# Patient Record
Sex: Male | Born: 1965
Health system: Southern US, Community
[De-identification: ages and names within clinical notes are randomized; demographics above are authoritative.]

## PROBLEM LIST (undated history)

## (undated) DIAGNOSIS — I10 Essential (primary) hypertension: Secondary | ICD-10-CM

## (undated) DIAGNOSIS — Z9109 Other allergy status, other than to drugs and biological substances: Secondary | ICD-10-CM

## (undated) DIAGNOSIS — N200 Calculus of kidney: Secondary | ICD-10-CM

## (undated) DIAGNOSIS — Z87442 Personal history of urinary calculi: Secondary | ICD-10-CM

## (undated) DIAGNOSIS — G4730 Sleep apnea, unspecified: Secondary | ICD-10-CM

## (undated) HISTORY — DX: Other allergy status, other than to drugs and biological substances: Z91.09

## (undated) HISTORY — DX: Essential (primary) hypertension: I10

## (undated) HISTORY — DX: Calculus of kidney: N20.0

---

## 2007-11-13 ENCOUNTER — Ambulatory Visit: Payer: Self-pay | Admitting: Surgery

## 2010-02-12 ENCOUNTER — Emergency Department: Payer: Self-pay | Admitting: Emergency Medicine

## 2012-02-29 ENCOUNTER — Encounter: Payer: Self-pay | Admitting: *Deleted

## 2012-02-29 ENCOUNTER — Telehealth: Payer: Self-pay | Admitting: *Deleted

## 2012-02-29 ENCOUNTER — Encounter: Payer: Self-pay | Admitting: Internal Medicine

## 2012-02-29 ENCOUNTER — Ambulatory Visit (INDEPENDENT_AMBULATORY_CARE_PROVIDER_SITE_OTHER): Payer: No Typology Code available for payment source | Admitting: Internal Medicine

## 2012-02-29 VITALS — BP 120/82 | HR 77 | Temp 97.7°F | Ht 72.0 in | Wt 275.0 lb

## 2012-02-29 DIAGNOSIS — R748 Abnormal levels of other serum enzymes: Secondary | ICD-10-CM

## 2012-02-29 DIAGNOSIS — R5383 Other fatigue: Secondary | ICD-10-CM

## 2012-02-29 DIAGNOSIS — M109 Gout, unspecified: Secondary | ICD-10-CM | POA: Insufficient documentation

## 2012-02-29 DIAGNOSIS — I10 Essential (primary) hypertension: Secondary | ICD-10-CM

## 2012-02-29 DIAGNOSIS — Z139 Encounter for screening, unspecified: Secondary | ICD-10-CM

## 2012-02-29 DIAGNOSIS — D72819 Decreased white blood cell count, unspecified: Secondary | ICD-10-CM

## 2012-02-29 DIAGNOSIS — Z0000 Encounter for general adult medical examination without abnormal findings: Secondary | ICD-10-CM

## 2012-02-29 DIAGNOSIS — Z125 Encounter for screening for malignant neoplasm of prostate: Secondary | ICD-10-CM

## 2012-02-29 DIAGNOSIS — D649 Anemia, unspecified: Secondary | ICD-10-CM | POA: Insufficient documentation

## 2012-02-29 DIAGNOSIS — Z9109 Other allergy status, other than to drugs and biological substances: Secondary | ICD-10-CM | POA: Insufficient documentation

## 2012-02-29 DIAGNOSIS — R5381 Other malaise: Secondary | ICD-10-CM

## 2012-02-29 LAB — LIPID PANEL
HDL: 39.2 mg/dL (ref >39.00)
Triglycerides: 142 mg/dL (ref 0.0–149.0)
VLDL: 28.4 mg/dL (ref 0.0–40.0)

## 2012-02-29 LAB — COMPREHENSIVE METABOLIC PANEL
Alkaline Phosphatase: 46 U/L (ref 39–117)
Creatinine, Ser: 1 mg/dL (ref 0.4–1.5)
Glucose, Bld: 106 mg/dL — ABNORMAL HIGH (ref 70–99)
Sodium: 135 mEq/L (ref 135–145)
Total Bilirubin: 0.5 mg/dL (ref 0.3–1.2)
Total Protein: 7.3 g/dL (ref 6.0–8.3)

## 2012-02-29 LAB — CBC WITH DIFFERENTIAL/PLATELET
Basophils Relative: 0.3 % (ref 0.0–3.0)
Eosinophils Absolute: 0.1 10*3/uL (ref 0.0–0.7)
HCT: 43 % (ref 39.0–52.0)
Lymphs Abs: 1.2 10*3/uL (ref 0.7–4.0)
MCHC: 33.3 g/dL (ref 30.0–36.0)
MCV: 89 fl (ref 78.0–100.0)
Monocytes Absolute: 0.4 10*3/uL (ref 0.1–1.0)
Neutrophils Relative %: 59.5 % (ref 43.0–77.0)
Platelets: 232 10*3/uL (ref 150.0–400.0)

## 2012-02-29 LAB — TSH: TSH: 1.3 u[IU]/mL (ref 0.35–5.50)

## 2012-02-29 LAB — FERRITIN: Ferritin: 41.9 ng/mL (ref 22.0–322.0)

## 2012-02-29 LAB — PSA: PSA: 0.17 ng/mL (ref 0.10–4.00)

## 2012-02-29 MED ORDER — LISINOPRIL 10 MG PO TABS: 10.0000 mg | ORAL_TABLET | Freq: Every day | ORAL | Status: DC

## 2012-02-29 MED ORDER — METOPROLOL SUCCINATE ER 25 MG PO TB24: 25.0000 mg | ORAL_TABLET | Freq: Every day | ORAL | Status: DC

## 2012-02-29 NOTE — Patient Instructions (Addendum)
It was nice seeing you today.  I am glad you have been doing well.  Let me know if you have any problems.  

## 2012-02-29 NOTE — Assessment & Plan Note (Signed)
Blood pressure has been doing well.  Continue Lisinopril and Toprol.  Check metabolic panel.

## 2012-02-29 NOTE — Assessment & Plan Note (Addendum)
Check cbc and iron studies.  Just gave blood this past week.

## 2012-02-29 NOTE — Progress Notes (Signed)
  Subjective:    [name], male    DOB: 12-13-1965, 46 y.o.   MRN: [id number]  HPI 46 year old male with past history of recurring allergy problems and hypertension who comes in today to follow up on these issues as well as for a complete physical exam.  States he is doing well.  Some allergy symptoms at times, but overall feels this is not a problem currently.  Some fatigue.  Overall feels good.  No cardiac symptoms with increased activity or exertion.  No nausea or vomiting.  No acid reflux.  No bowel changes.  States his blood pressure has been doing well.  (114/72).    Past Medical History  Diagnosis Date  . Hypertension   . Environmental allergies   . Nephrolithiasis     Current Outpatient Prescriptions on File Prior to Visit  Medication Sig Dispense Refill  . lisinopril (PRINIVIL,ZESTRIL) 10 MG tablet Take 1 tablet (10 mg total) by mouth daily.  90 tablet  3  . metoprolol succinate (TOPROL-XL) 25 MG 24 hr tablet Take 1 tablet (25 mg total) by mouth daily.  90 tablet  3    Review of Systems Patient denies any headache, lightheadedness or dizziness.  Allergy symptoms controlled.  No chest pain, tightness or palpitations.  No increased shortness of breath, cough or congestion.  No nausea or vomiting.  No abdominal pain or cramping.  No bowel change, such as diarrhea, constipation, BRBPR or melana.  No urine change.        Objective:   Physical Exam Filed Vitals:   02/29/12 0807  BP: 120/82  Pulse: 77  Temp: 97.7 F (73.6 C)   46 year old male in no acute distress.  HEENT:  Nares - clear.  Oropharynx - without lesions. NECK:  Supple.  Nontender.  No audible carotid bruit.  HEART:  Appears to be regular.   LUNGS:  No crackles or wheezing audible.  Respirations even and unlabored.   RADIAL PULSE:  Equal bilaterally.  ABDOMEN:  Soft.  Nontender.  Bowel sounds present and normal.  No audible abdominal bruit.  GU:  Normal descended testicles.  No palpable  testicular nodules.   RECTAL:  Could not appreciate any palpable prostate nodules.  Heme negative.   EXTREMITIES:  No increased edema present.  DP pulses palpable and equal bilaterally.         Assessment & Plan:  HEALTH MAINTENANCE.  Physical today.  PSA 02/15/11 - .16.  Recheck PSA today.

## 2012-02-29 NOTE — Telephone Encounter (Signed)
Opened in error

## 2012-03-04 ENCOUNTER — Encounter: Payer: Self-pay | Admitting: Internal Medicine

## 2012-03-04 NOTE — Assessment & Plan Note (Signed)
Off allopurinol.  No recent flares.  Follow.    

## 2012-03-04 NOTE — Assessment & Plan Note (Signed)
Doing well.  Follow.  

## 2012-03-06 ENCOUNTER — Encounter: Payer: Self-pay | Admitting: *Deleted

## 2012-03-13 ENCOUNTER — Other Ambulatory Visit (INDEPENDENT_AMBULATORY_CARE_PROVIDER_SITE_OTHER): Payer: No Typology Code available for payment source

## 2012-03-13 DIAGNOSIS — R748 Abnormal levels of other serum enzymes: Secondary | ICD-10-CM

## 2012-03-13 DIAGNOSIS — D72819 Decreased white blood cell count, unspecified: Secondary | ICD-10-CM

## 2012-03-13 LAB — CBC WITH DIFFERENTIAL/PLATELET
Basophils Absolute: 0 10*3/uL (ref 0.0–0.1)
Eosinophils Absolute: 0.1 10*3/uL (ref 0.0–0.7)
Lymphocytes Relative: 30.3 % (ref 12.0–46.0)
MCHC: 34 g/dL (ref 30.0–36.0)
Monocytes Relative: 9.4 % (ref 3.0–12.0)
Neutro Abs: 2.8 10*3/uL (ref 1.4–7.7)
Neutrophils Relative %: 57.7 % (ref 43.0–77.0)
Platelets: 224 10*3/uL (ref 150.0–400.0)
RDW: 13.4 % (ref 11.5–14.6)

## 2012-03-13 LAB — HEPATIC FUNCTION PANEL
ALT: 58 U/L — ABNORMAL HIGH (ref 0–53)
AST: 40 U/L — ABNORMAL HIGH (ref 0–37)
Albumin: 4.3 g/dL (ref 3.5–5.2)
Alkaline Phosphatase: 45 U/L (ref 39–117)
Bilirubin, Direct: 0.1 mg/dL (ref 0.0–0.3)
Total Protein: 7.1 g/dL (ref 6.0–8.3)

## 2012-03-14 ENCOUNTER — Other Ambulatory Visit: Payer: Self-pay | Admitting: Internal Medicine

## 2012-03-14 DIAGNOSIS — R945 Abnormal results of liver function studies: Secondary | ICD-10-CM

## 2012-03-14 NOTE — Progress Notes (Signed)
Order placed for f/u liver panel.  

## 2012-03-16 ENCOUNTER — Encounter: Payer: Self-pay | Admitting: Internal Medicine

## 2012-04-14 ENCOUNTER — Other Ambulatory Visit (INDEPENDENT_AMBULATORY_CARE_PROVIDER_SITE_OTHER): Payer: No Typology Code available for payment source

## 2012-04-14 DIAGNOSIS — R945 Abnormal results of liver function studies: Secondary | ICD-10-CM

## 2012-04-14 DIAGNOSIS — R7989 Other specified abnormal findings of blood chemistry: Secondary | ICD-10-CM

## 2012-04-14 LAB — HEPATIC FUNCTION PANEL
AST: 41 U/L — ABNORMAL HIGH (ref 0–37)
Albumin: 4.2 g/dL (ref 3.5–5.2)
Alkaline Phosphatase: 50 U/L (ref 39–117)
Bilirubin, Direct: 0.1 mg/dL (ref 0.0–0.3)
Total Bilirubin: 0.6 mg/dL (ref 0.3–1.2)

## 2012-04-17 ENCOUNTER — Telehealth: Payer: Self-pay | Admitting: Internal Medicine

## 2012-04-17 NOTE — Telephone Encounter (Signed)
Notified of labs via my chart 

## 2012-04-24 ENCOUNTER — Encounter: Payer: Self-pay | Admitting: Internal Medicine

## 2012-04-25 ENCOUNTER — Telehealth: Payer: Self-pay | Admitting: Internal Medicine

## 2012-04-25 NOTE — Telephone Encounter (Signed)
Please explain to Derek Griffith that I do not know how much this will cost him.  He will need to contact his insurance company to find out the out of pocket cost.  If he wants to get this done somewhere other than  regional - then I can schedule it wherever it is cheaper.     Thanks.

## 2012-04-26 NOTE — Telephone Encounter (Signed)
Dr. Lorin Picket, I am not sure what procedure Mr. Bradner will need to contact his insurance about. I don't mind calling patient, however I need a little more information. Thank you

## 2012-04-26 NOTE — Telephone Encounter (Signed)
Abdominal ultrasound - abnormal liver function

## 2012-04-26 NOTE — Telephone Encounter (Signed)
I have left voice mail asking patient to return my call.

## 2012-04-27 NOTE — Telephone Encounter (Signed)
Patient returned my call and I advised him he would have to call the hospital and see how much an abdominal ultrasound would cost. I also advised patient to call his insurance company and see if he went to a stand alone clinic that offered Ultrasounds might be cheaper than going through radiology of a hospital.  I also advised patient that he could try to get charity care from [facility], but he had to have scan done first so he would have a balance.  He doesn't think he will qualify for charity care and will call the hospital to see how much it will cost.

## 2012-05-06 ENCOUNTER — Other Ambulatory Visit: Payer: Self-pay

## 2012-05-16 ENCOUNTER — Other Ambulatory Visit: Payer: Self-pay | Admitting: *Deleted

## 2012-05-16 MED ORDER — METOPROLOL SUCCINATE ER 25 MG PO TB24: 25.0000 mg | ORAL_TABLET | Freq: Every day | ORAL | Status: DC

## 2012-08-22 ENCOUNTER — Encounter: Payer: Self-pay | Admitting: Internal Medicine

## 2012-08-22 ENCOUNTER — Ambulatory Visit (INDEPENDENT_AMBULATORY_CARE_PROVIDER_SITE_OTHER): Payer: No Typology Code available for payment source | Admitting: Internal Medicine

## 2012-08-22 VITALS — BP 130/80 | HR 67 | Temp 98.2°F | Ht 72.0 in | Wt 274.8 lb

## 2012-08-22 DIAGNOSIS — I10 Essential (primary) hypertension: Secondary | ICD-10-CM

## 2012-08-22 DIAGNOSIS — R945 Abnormal results of liver function studies: Secondary | ICD-10-CM

## 2012-08-22 DIAGNOSIS — D649 Anemia, unspecified: Secondary | ICD-10-CM

## 2012-08-22 DIAGNOSIS — M109 Gout, unspecified: Secondary | ICD-10-CM

## 2012-08-22 DIAGNOSIS — Z9109 Other allergy status, other than to drugs and biological substances: Secondary | ICD-10-CM

## 2012-08-22 DIAGNOSIS — K769 Liver disease, unspecified: Secondary | ICD-10-CM

## 2012-08-22 LAB — HEPATIC FUNCTION PANEL
ALT: 47 U/L (ref 0–53)
AST: 30 U/L (ref 0–37)
Albumin: 4 g/dL (ref 3.5–5.2)
Alkaline Phosphatase: 43 U/L (ref 39–117)

## 2012-08-23 ENCOUNTER — Encounter: Payer: Self-pay | Admitting: Internal Medicine

## 2012-08-26 ENCOUNTER — Encounter: Payer: Self-pay | Admitting: Internal Medicine

## 2012-08-26 DIAGNOSIS — R945 Abnormal results of liver function studies: Secondary | ICD-10-CM | POA: Insufficient documentation

## 2012-08-26 NOTE — Assessment & Plan Note (Signed)
Off allopurinol.  No recent flares.  Follow.    

## 2012-08-26 NOTE — Assessment & Plan Note (Signed)
Follow cbc.  

## 2012-08-26 NOTE — Assessment & Plan Note (Signed)
Declined abdominal ultrasound.  Discussed with him today.  He continues to decline.  Recheck liver panel today.

## 2012-08-26 NOTE — Assessment & Plan Note (Signed)
Doing well.  Follow.  

## 2012-08-26 NOTE — Assessment & Plan Note (Signed)
Blood pressure has been doing well.  Continue Lisinopril and Toprol.  Check metabolic panel.

## 2012-08-26 NOTE — Progress Notes (Signed)
  Subjective:    [name], male    DOB: Mar 10, 1966, 47 y.o.   MRN: [id number]  HPI 47 year old male with past history of recurring allergy problems and hypertension who comes in today for a scheduled follow up.  States he is doing well.  No significant allergy problems reported.  Overall feels good.  No cardiac symptoms with increased activity or exertion.  No nausea or vomiting.  No acid reflux.  No bowel changes.  His blood pressure has been doing well.  See attached list.       Past Medical History  Diagnosis Date  . Hypertension   . Environmental allergies   . Nephrolithiasis     Current Outpatient Prescriptions on File Prior to Visit  Medication Sig Dispense Refill  . fish oil-omega-3 fatty acids 1000 MG capsule Take 1 g by mouth daily.      Marland Kitchen lisinopril (PRINIVIL,ZESTRIL) 10 MG tablet Take 1 tablet (10 mg total) by mouth daily.  90 tablet  3  . metoprolol succinate (TOPROL-XL) 25 MG 24 hr tablet Take 1 tablet (25 mg total) by mouth daily.  90 tablet  1  . Multiple Vitamin (MULTIVITAMIN) tablet Take 1 tablet by mouth daily.       No current facility-administered medications on file prior to visit.    Review of Systems Patient denies any headache, lightheadedness or dizziness.  Allergy symptoms controlled.  No chest pain, tightness or palpitations.  No increased shortness of breath, cough or congestion.  No nausea or vomiting.  No abdominal pain or cramping.  No bowel change, such as diarrhea, constipation, BRBPR or melana.  No urine change.  No back pain.  No hematuria.       Objective:   Physical Exam  Filed Vitals:   08/22/12 0758  BP: 130/80  Pulse: 67  Temp: 98.2 F (12.47 C)   47 year old male in no acute distress.  HEENT:  Nares - clear.  Oropharynx - without lesions. NECK:  Supple.  Nontender.  No audible carotid bruit.  HEART:  Appears to be regular.   LUNGS:  No crackles or wheezing audible.  Respirations even and unlabored.   RADIAL PULSE:   Equal bilaterally.  ABDOMEN:  Soft.  Nontender.  Bowel sounds present and normal.  No audible abdominal bruit.    EXTREMITIES:  No increased edema present.  DP pulses palpable and equal bilaterally.         Assessment & Plan:  HEALTH MAINTENANCE.  Physical 02/29/12.  PSA 02/29/12 - .17.

## 2012-08-28 ENCOUNTER — Telehealth: Payer: Self-pay

## 2012-08-28 NOTE — Telephone Encounter (Signed)
My chart Message: I reviewed your recent lab results. Your liver panel is within normal limits. Let me know if you change your mind about the abdominal ultrasound. I would still recommend doing the ultrasound - given the previous elevation in the liver enzymes. We will continue to follow the liver panel   Patient notified ^^^^^^^^^^^

## 2012-09-13 ENCOUNTER — Encounter: Payer: Self-pay | Admitting: Internal Medicine

## 2012-09-14 ENCOUNTER — Telehealth: Payer: Self-pay | Admitting: *Deleted

## 2012-09-14 NOTE — Telephone Encounter (Signed)
Pt sent a mychart message requesting you to give him a call. He does not want to speak with me.............Derek Griffith

## 2012-09-15 NOTE — Telephone Encounter (Signed)
I sent pt a my chart message back regarding his request.  Please notify him that I have had meetings this week after work that is why I have not called him back.  Let him know that I have appts this pm and will not be in the office.  If he still needs me to call him after the my chart message - let me know.

## 2012-09-20 ENCOUNTER — Telehealth: Payer: Self-pay | Admitting: Internal Medicine

## 2012-09-20 NOTE — Telephone Encounter (Signed)
Called pt and questions answered.   

## 2013-01-25 ENCOUNTER — Other Ambulatory Visit: Payer: Self-pay

## 2013-02-02 ENCOUNTER — Telehealth: Payer: Self-pay | Admitting: Internal Medicine

## 2013-02-02 NOTE — Telephone Encounter (Signed)
Please help with this.  I did not charge him as a new pt to the clinic.  I charged the visit as an established patient.  If the problem is that it was not coded as a wellness then I am ok if they change the visit to a wellness visit.  Let me know if problems.    Thanks.

## 2013-02-02 NOTE — Telephone Encounter (Signed)
Pt calling upset that he is being billed for appt 02/29/2012.  States it should have been a physical that would be covered 100% by insurance.  Advised pt that was scheduled as a New Pt visit.  Pt states he cannot help Dr. Lorin Picket moved from one place to another and it was time for his physical, which he states he did receive.  Pt would like this to be changed so that his insurance will cover.  Pt would like a call.

## 2013-02-05 NOTE — Telephone Encounter (Signed)
I have sent an email to charge correction after talking with patient. I have also let him know that I am waiting for their response.

## 2013-02-14 NOTE — Telephone Encounter (Signed)
Spoke with patient to let him know that they had refiled the claim and he should see a new EOB shortly.

## 2013-02-14 NOTE — Telephone Encounter (Signed)
02/13/13 received an email from charge correction stating this request has been taken care of.

## 2013-03-06 ENCOUNTER — Other Ambulatory Visit: Payer: Self-pay | Admitting: Internal Medicine

## 2013-03-08 ENCOUNTER — Encounter: Payer: Self-pay | Admitting: Internal Medicine

## 2013-03-08 ENCOUNTER — Other Ambulatory Visit: Payer: Self-pay | Admitting: Internal Medicine

## 2013-03-08 ENCOUNTER — Ambulatory Visit (INDEPENDENT_AMBULATORY_CARE_PROVIDER_SITE_OTHER): Payer: No Typology Code available for payment source | Admitting: Internal Medicine

## 2013-03-08 VITALS — BP 110/80 | HR 76 | Temp 98.2°F | Ht 72.0 in | Wt 279.8 lb

## 2013-03-08 DIAGNOSIS — Z125 Encounter for screening for malignant neoplasm of prostate: Secondary | ICD-10-CM

## 2013-03-08 DIAGNOSIS — Z9109 Other allergy status, other than to drugs and biological substances: Secondary | ICD-10-CM

## 2013-03-08 DIAGNOSIS — D649 Anemia, unspecified: Secondary | ICD-10-CM

## 2013-03-08 DIAGNOSIS — I10 Essential (primary) hypertension: Secondary | ICD-10-CM

## 2013-03-08 DIAGNOSIS — K769 Liver disease, unspecified: Secondary | ICD-10-CM

## 2013-03-08 DIAGNOSIS — M109 Gout, unspecified: Secondary | ICD-10-CM

## 2013-03-08 DIAGNOSIS — D72819 Decreased white blood cell count, unspecified: Secondary | ICD-10-CM | POA: Insufficient documentation

## 2013-03-08 DIAGNOSIS — R945 Abnormal results of liver function studies: Secondary | ICD-10-CM

## 2013-03-08 DIAGNOSIS — Z13220 Encounter for screening for lipoid disorders: Secondary | ICD-10-CM

## 2013-03-08 LAB — HEPATIC FUNCTION PANEL
ALT: 59 U/L — ABNORMAL HIGH (ref 0–53)
AST: 42 U/L — ABNORMAL HIGH (ref 0–37)
Albumin: 4.6 g/dL (ref 3.5–5.2)
Alkaline Phosphatase: 51 U/L (ref 39–117)
Bilirubin, Direct: 0.1 mg/dL (ref 0.0–0.3)
Total Bilirubin: 0.8 mg/dL (ref 0.3–1.2)

## 2013-03-08 LAB — LIPID PANEL
LDL Cholesterol: 79 mg/dL (ref 0–99)
Total CHOL/HDL Ratio: 4
Triglycerides: 197 mg/dL — ABNORMAL HIGH (ref 0.0–149.0)

## 2013-03-08 LAB — CBC WITH DIFFERENTIAL/PLATELET
Basophils Relative: 0.7 % (ref 0.0–3.0)
Eosinophils Absolute: 0.1 10*3/uL (ref 0.0–0.7)
Eosinophils Relative: 1.4 % (ref 0.0–5.0)
Hemoglobin: 15.2 g/dL (ref 13.0–17.0)
Lymphocytes Relative: 29.8 % (ref 12.0–46.0)
MCHC: 34.1 g/dL (ref 30.0–36.0)
Neutro Abs: 2.8 10*3/uL (ref 1.4–7.7)
Neutrophils Relative %: 56.7 % (ref 43.0–77.0)
RBC: 5.19 Mil/uL (ref 4.22–5.81)
WBC: 5 10*3/uL (ref 4.5–10.5)

## 2013-03-08 LAB — BASIC METABOLIC PANEL
CO2: 29 mEq/L (ref 19–32)
Calcium: 9.3 mg/dL (ref 8.4–10.5)
Chloride: 103 mEq/L (ref 96–112)
Sodium: 138 mEq/L (ref 135–145)

## 2013-03-08 MED ORDER — FLUTICASONE PROPIONATE 50 MCG/ACT NA SUSP: 2.0000 | Freq: Every day | NASAL | Status: DC

## 2013-03-08 NOTE — Assessment & Plan Note (Signed)
Recheck cbc today to confirm stable.  

## 2013-03-08 NOTE — Progress Notes (Signed)
  Subjective:    [name], male    DOB: 1966/03/10, 47 y.o.   MRN: [id number]  HPI 47 year old male with past history of recurring allergy problems and hypertension who comes in today to follow up on these issues as well as for a complete physical exam.   States he is doing well.  No significant allergy problems reported.  Overall feels good.  No cardiac symptoms with increased activity or exertion.  No nausea or vomiting.  No acid reflux.  No bowel changes.  His blood pressure has been doing well.  Overall feels good.      Past Medical History  Diagnosis Date  . Hypertension   . Environmental allergies   . Nephrolithiasis     Current Outpatient Prescriptions on File Prior to Visit  Medication Sig Dispense Refill  . fish oil-omega-3 fatty acids 1000 MG capsule Take 1 g by mouth daily.      Marland Kitchen lisinopril (PRINIVIL,ZESTRIL) 10 MG tablet TAKE ONE TABLET BY MOUTH EVERY DAY  90 tablet  1  . metoprolol succinate (TOPROL-XL) 25 MG 24 hr tablet Take 1 tablet (25 mg total) by mouth daily.  90 tablet  1  . Multiple Vitamin (MULTIVITAMIN) tablet Take 1 tablet by mouth daily.       No current facility-administered medications on file prior to visit.    Review of Systems Patient denies any headache, lightheadedness or dizziness.  Allergy symptoms controlled.  No chest pain, tightness or palpitations.  No increased shortness of breath, cough or congestion.  No nausea or vomiting.  No abdominal pain or cramping.  No bowel change, such as diarrhea, constipation, BRBPR or melana.  No urine change.  No back pain.  No hematuria.       Objective:   Physical Exam  Filed Vitals:   03/08/13 0822  BP: 110/80  Pulse: 76  Temp: 98.2 F (36.8 C)   Blood pressure recheck:  122/78, pulse 40  47 year old male in no acute distress.  HEENT:  Nares - clear.  Oropharynx - without lesions. NECK:  Supple.  Nontender.  No audible carotid bruit.  HEART:  Appears to be regular.   LUNGS:  No  crackles or wheezing audible.  Respirations even and unlabored.   RADIAL PULSE:  Equal bilaterally.  ABDOMEN:  Soft.  Nontender.  Bowel sounds present and normal.  No audible abdominal bruit.  GU:  Normal descended testicles.  No palpable testicular nodules.   RECTAL:  Could not appreciate any palpable prostate nodules.  Heme negative.   EXTREMITIES:  No increased edema present.  DP pulses palpable and equal bilaterally.         Assessment & Plan:  HEALTH MAINTENANCE.  Physical today.  PSA 02/29/12 - .17.   Check psa today.

## 2013-03-08 NOTE — Progress Notes (Signed)
Orders placed for labs

## 2013-03-08 NOTE — Progress Notes (Signed)
Pre-visit discussion using our clinic review tool. No additional management support is needed unless otherwise documented below in the visit note.  

## 2013-03-08 NOTE — Assessment & Plan Note (Addendum)
Doing well.  Follow.  Flonase rx refilled.

## 2013-03-08 NOTE — Assessment & Plan Note (Signed)
Declined abdominal ultrasound.  Discussed with him today.  He continues to decline.  Recheck liver panel today.

## 2013-03-08 NOTE — Assessment & Plan Note (Signed)
Off allopurinol.  No recent flares.  Follow.    

## 2013-03-08 NOTE — Assessment & Plan Note (Signed)
Follow cbc.  Last hgb normal.   

## 2013-03-08 NOTE — Assessment & Plan Note (Signed)
Blood pressure has been doing well.  Continue Lisinopril and Toprol.  Check metabolic panel.

## 2013-03-09 ENCOUNTER — Encounter: Payer: Self-pay | Admitting: *Deleted

## 2013-03-09 ENCOUNTER — Other Ambulatory Visit: Payer: Self-pay | Admitting: Internal Medicine

## 2013-03-09 ENCOUNTER — Encounter: Payer: Self-pay | Admitting: Internal Medicine

## 2013-03-09 DIAGNOSIS — R945 Abnormal results of liver function studies: Secondary | ICD-10-CM

## 2013-03-09 NOTE — Progress Notes (Signed)
Order placed for f/u liver panel.  

## 2013-03-21 ENCOUNTER — Other Ambulatory Visit: Payer: Self-pay | Admitting: *Deleted

## 2013-03-21 MED ORDER — METOPROLOL SUCCINATE ER 25 MG PO TB24: 25.0000 mg | ORAL_TABLET | Freq: Every day | ORAL | Status: DC

## 2013-04-02 ENCOUNTER — Other Ambulatory Visit (INDEPENDENT_AMBULATORY_CARE_PROVIDER_SITE_OTHER): Payer: No Typology Code available for payment source

## 2013-04-02 ENCOUNTER — Encounter: Payer: Self-pay | Admitting: Internal Medicine

## 2013-04-02 DIAGNOSIS — K769 Liver disease, unspecified: Secondary | ICD-10-CM

## 2013-04-02 DIAGNOSIS — R945 Abnormal results of liver function studies: Secondary | ICD-10-CM

## 2013-04-02 LAB — HEPATIC FUNCTION PANEL
ALT: 46 U/L (ref 0–53)
AST: 30 U/L (ref 0–37)
Albumin: 4.1 g/dL (ref 3.5–5.2)
Alkaline Phosphatase: 44 U/L (ref 39–117)
BILIRUBIN TOTAL: 0.7 mg/dL (ref 0.3–1.2)
Bilirubin, Direct: 0.1 mg/dL (ref 0.0–0.3)
Total Protein: 6.7 g/dL (ref 6.0–8.3)

## 2013-08-07 ENCOUNTER — Other Ambulatory Visit: Payer: Self-pay | Admitting: Internal Medicine

## 2013-08-07 MED ORDER — LISINOPRIL 10 MG PO TABS: ORAL_TABLET | ORAL | Status: DC

## 2013-08-09 ENCOUNTER — Other Ambulatory Visit: Payer: Self-pay | Admitting: *Deleted

## 2013-08-09 ENCOUNTER — Encounter: Payer: Self-pay | Admitting: Internal Medicine

## 2013-08-09 MED ORDER — LISINOPRIL 10 MG PO TABS: ORAL_TABLET | ORAL | Status: DC

## 2013-09-04 ENCOUNTER — Ambulatory Visit: Payer: No Typology Code available for payment source | Admitting: Internal Medicine

## 2013-09-14 ENCOUNTER — Encounter: Payer: Self-pay | Admitting: Internal Medicine

## 2013-09-14 ENCOUNTER — Ambulatory Visit (INDEPENDENT_AMBULATORY_CARE_PROVIDER_SITE_OTHER): Payer: No Typology Code available for payment source | Admitting: Internal Medicine

## 2013-09-14 VITALS — BP 124/90 | HR 63 | Temp 98.3°F | Ht 72.0 in | Wt 275.8 lb

## 2013-09-14 DIAGNOSIS — K769 Liver disease, unspecified: Secondary | ICD-10-CM

## 2013-09-14 DIAGNOSIS — Z9109 Other allergy status, other than to drugs and biological substances: Secondary | ICD-10-CM

## 2013-09-14 DIAGNOSIS — D649 Anemia, unspecified: Secondary | ICD-10-CM

## 2013-09-14 DIAGNOSIS — E7800 Pure hypercholesterolemia, unspecified: Secondary | ICD-10-CM

## 2013-09-14 DIAGNOSIS — D72819 Decreased white blood cell count, unspecified: Secondary | ICD-10-CM

## 2013-09-14 DIAGNOSIS — I10 Essential (primary) hypertension: Secondary | ICD-10-CM

## 2013-09-14 DIAGNOSIS — M533 Sacrococcygeal disorders, not elsewhere classified: Secondary | ICD-10-CM

## 2013-09-14 DIAGNOSIS — R945 Abnormal results of liver function studies: Secondary | ICD-10-CM

## 2013-09-14 LAB — LIPID PANEL
Cholesterol: 147 mg/dL (ref 0–200)
HDL: 45.1 mg/dL (ref >39.00)
LDL Cholesterol: 72 mg/dL (ref 0–99)
NonHDL: 101.9
Total CHOL/HDL Ratio: 3
Triglycerides: 151 mg/dL — ABNORMAL HIGH (ref 0.0–149.0)
VLDL: 30.2 mg/dL (ref 0.0–40.0)

## 2013-09-14 LAB — BASIC METABOLIC PANEL
BUN: 12 mg/dL (ref 6–23)
CHLORIDE: 105 meq/L (ref 96–112)
CO2: 27 meq/L (ref 19–32)
Calcium: 9.2 mg/dL (ref 8.4–10.5)
Creatinine, Ser: 1.1 mg/dL (ref 0.4–1.5)
GFR: 79.6 mL/min (ref >60.00)
GLUCOSE: 101 mg/dL — AB (ref 70–99)
POTASSIUM: 4.1 meq/L (ref 3.5–5.1)
Sodium: 139 mEq/L (ref 135–145)

## 2013-09-14 NOTE — Progress Notes (Signed)
Pre visit review using our clinic review tool, if applicable. No additional management support is needed unless otherwise documented below in the visit note. 

## 2013-09-14 NOTE — Progress Notes (Signed)
  Subjective:    [name], male    DOB: 1966-01-11, 48 y.o.   MRN: [id number]  HPI 48 year old male with past history of recurring allergy problems and hypertension who comes in today for a scheduled follow up.  States he is doing well.  No significant allergy problems reported.  Overall feels good.  No cardiac symptoms with increased activity or exertion.  No nausea or vomiting.  No acid reflux.  No bowel changes.  He has not been checking his blood pressure regularly.  Overall feels good.  He does report having pain over his tail bone.  Only notices when he is sitting in a certain position.  Does not affect him when standing, walking, bending, etc.       Past Medical History  Diagnosis Date  . Hypertension   . Environmental allergies   . Nephrolithiasis     Current Outpatient Prescriptions on File Prior to Visit  Medication Sig Dispense Refill  . fish oil-omega-3 fatty acids 1000 MG capsule Take 1 g by mouth daily.      . fluticasone (FLONASE) 50 MCG/ACT nasal spray Place 2 sprays into both nostrils daily.  16 g  2  . lisinopril (PRINIVIL,ZESTRIL) 10 MG tablet TAKE ONE TABLET BY MOUTH EVERY DAY  90 tablet  0  . metoprolol succinate (TOPROL-XL) 25 MG 24 hr tablet Take 1 tablet (25 mg total) by mouth daily.  90 tablet  1  . Multiple Vitamin (MULTIVITAMIN) tablet Take 1 tablet by mouth daily.       No current facility-administered medications on file prior to visit.    Review of Systems Patient denies any headache, lightheadedness or dizziness.  Allergy symptoms controlled.  No chest pain, tightness or palpitations.  No increased shortness of breath, cough or congestion.  No nausea or vomiting.  No acid reflux.  No abdominal pain or cramping.  No bowel change, such as diarrhea, constipation, BRBPR or melana.  No urine change.   No hematuria.  Pain over the tailbone as outlined.        Objective:   Physical Exam  Filed Vitals:   09/14/13 0801  BP: 124/90  Pulse:  63  Temp: 98.3 F (36.8 C)   Blood pressure recheck:  10/16  48 year old male in no acute distress.  HEENT:  Nares - clear.  Oropharynx - without lesions. NECK:  Supple.  Nontender.  No audible carotid bruit.  HEART:  Appears to be regular.   LUNGS:  No crackles or wheezing audible.  Respirations even and unlabored.   RADIAL PULSE:  Equal bilaterally.  ABDOMEN:  Soft.  Nontender.  Bowel sounds present and normal.  No audible abdominal bruit.   EXTREMITIES:  No increased edema present.  DP pulses palpable and equal bilaterally. MSK:  Increased pain to palpation over the tip of the sacrum.  No bruising.  No rash.           Assessment & Plan:  HEALTH MAINTENANCE.  Physical 03/08/13.  PSA 03/08/13 - .18.     I spent 25 minutes with the patient and more than 50% of the time was spent in consultation regarding the above (specifically discussed his blood pressure and sacral pain).

## 2013-09-15 ENCOUNTER — Encounter: Payer: Self-pay | Admitting: Internal Medicine

## 2013-09-15 DIAGNOSIS — M533 Sacrococcygeal disorders, not elsewhere classified: Secondary | ICD-10-CM | POA: Insufficient documentation

## 2013-09-15 NOTE — Assessment & Plan Note (Signed)
Doing well.  Follow.  

## 2013-09-15 NOTE — Assessment & Plan Note (Signed)
Last white blood cell count wnl.   

## 2013-09-15 NOTE — Assessment & Plan Note (Signed)
Off allopurinol.  No recent flares.  Follow.

## 2013-09-15 NOTE — Assessment & Plan Note (Signed)
Blood pressure has been doing well.  Slightly elevated today.  Continue Lisinopril and Toprol.  Check metabolic panel.  Have him spot check his pressure.  Get him back in soon to reassess.

## 2013-09-15 NOTE — Assessment & Plan Note (Signed)
Declined abdominal ultrasound.  Recheck liver panel today.

## 2013-09-15 NOTE — Assessment & Plan Note (Signed)
Increased pain to palpation over his tail bone.  Exam as outlined.  Try conservative measures.  Cushion as discussed.  Notify me if pain persist.

## 2013-09-15 NOTE — Assessment & Plan Note (Signed)
Follow cbc.  Last hgb normal.

## 2013-10-23 ENCOUNTER — Other Ambulatory Visit: Payer: Self-pay | Admitting: Internal Medicine

## 2013-11-02 ENCOUNTER — Ambulatory Visit: Payer: No Typology Code available for payment source | Admitting: Internal Medicine

## 2013-11-13 ENCOUNTER — Ambulatory Visit (INDEPENDENT_AMBULATORY_CARE_PROVIDER_SITE_OTHER): Payer: No Typology Code available for payment source | Admitting: Internal Medicine

## 2013-11-13 ENCOUNTER — Encounter: Payer: Self-pay | Admitting: Internal Medicine

## 2013-11-13 VITALS — BP 122/90 | HR 66 | Temp 98.0°F | Ht 72.0 in | Wt 276.5 lb

## 2013-11-13 DIAGNOSIS — R945 Abnormal results of liver function studies: Secondary | ICD-10-CM

## 2013-11-13 DIAGNOSIS — K769 Liver disease, unspecified: Secondary | ICD-10-CM

## 2013-11-13 DIAGNOSIS — I10 Essential (primary) hypertension: Secondary | ICD-10-CM

## 2013-11-13 MED ORDER — METOPROLOL TARTRATE 25 MG PO TABS: 25.0000 mg | ORAL_TABLET | Freq: Two times a day (BID) | ORAL | Status: DC

## 2013-11-13 NOTE — Progress Notes (Signed)
Pre visit review using our clinic review tool, if applicable. No additional management support is needed unless otherwise documented below in the visit note. 

## 2013-11-15 ENCOUNTER — Encounter: Payer: Self-pay | Admitting: Internal Medicine

## 2013-11-15 NOTE — Assessment & Plan Note (Signed)
Blood pressure as outlined.  Better on my check today.  Continue lisinopril as he is doing.  Change Toprol XL to metoprolol 25mg  bid.  Follow pressures.  Send in readings over the next few weeks.

## 2013-11-15 NOTE — Progress Notes (Signed)
  Subjective:    [name], male    DOB: 12-18-1965, 48 y.o.   MRN: [id number]  HPI 48 year old male with past history of recurring allergy problems and hypertension who comes in today for a scheduled follow up.  Here to f/u regarding his blood pressure.  States he is doing well.  No significant allergy problems reported.  Overall feels good.  No cardiac symptoms with increased activity or exertion.  States his blood pressure has been averaging 130-140/70-80s.  Wants to change his Toprol XL to short acting - secondary to cost.       Past Medical History  Diagnosis Date  . Hypertension   . Environmental allergies   . Nephrolithiasis     Current Outpatient Prescriptions on File Prior to Visit  Medication Sig Dispense Refill  . fish oil-omega-3 fatty acids 1000 MG capsule Take 1 g by mouth daily.      . fluticasone (FLONASE) 50 MCG/ACT nasal spray Place 2 sprays into both nostrils daily.  16 g  2  . lisinopril (PRINIVIL,ZESTRIL) 10 MG tablet TAKE ONE TABLET BY MOUTH EVERY DAY  90 tablet  0  . Multiple Vitamin (MULTIVITAMIN) tablet Take 1 tablet by mouth daily.       No current facility-administered medications on file prior to visit.    Review of Systems Patient denies any headache, lightheadedness or dizziness.  Allergy symptoms controlled.  No chest pain, tightness or palpitations.  No increased shortness of breath, cough or congestion.  No nausea or vomiting.  No acid reflux.  No abdominal pain or cramping.  No bowel change, such as diarrhea, constipation, BRBPR or melana.  No urine change.  Blood pressure as outlined.       Objective:   Physical Exam  Filed Vitals:   11/13/13 0957  BP: 122/90  Pulse: 66  Temp: 98 F (36.7 C)   Blood pressure recheck:  32/22  48 year old male in no acute distress.  HEENT:  Nares - clear.  Oropharynx - without lesions. NECK:  Supple.  Nontender.  No audible carotid bruit.  HEART:  Appears to be regular.   LUNGS:  No  crackles or wheezing audible.  Respirations even and unlabored.   RADIAL PULSE:  Equal bilaterally.  ABDOMEN:  Soft.  Nontender.  Bowel sounds present and normal.  No audible abdominal bruit.   EXTREMITIES:  No increased edema present.  DP pulses palpable and equal bilaterally.          Assessment & Plan:  HEALTH MAINTENANCE.  Physical 03/08/13.  PSA 03/08/13 - .18.     I spent 15 minutes with the patient and more than 50% of the time was spent in consultation regarding the above.

## 2013-11-15 NOTE — Assessment & Plan Note (Signed)
Has declined abdominal ultrasound.  Follow liver panel.  Liver panel 04/02/13 wnl.

## 2014-03-12 ENCOUNTER — Encounter: Payer: Self-pay | Admitting: Internal Medicine

## 2014-03-12 ENCOUNTER — Ambulatory Visit (INDEPENDENT_AMBULATORY_CARE_PROVIDER_SITE_OTHER): Payer: No Typology Code available for payment source | Admitting: Internal Medicine

## 2014-03-12 VITALS — BP 124/80 | HR 61 | Temp 97.9°F | Ht 72.0 in | Wt 281.5 lb

## 2014-03-12 DIAGNOSIS — K7689 Other specified diseases of liver: Secondary | ICD-10-CM

## 2014-03-12 DIAGNOSIS — D649 Anemia, unspecified: Secondary | ICD-10-CM

## 2014-03-12 DIAGNOSIS — R945 Abnormal results of liver function studies: Secondary | ICD-10-CM

## 2014-03-12 DIAGNOSIS — I10 Essential (primary) hypertension: Secondary | ICD-10-CM

## 2014-03-12 DIAGNOSIS — D72819 Decreased white blood cell count, unspecified: Secondary | ICD-10-CM

## 2014-03-12 DIAGNOSIS — M10079 Idiopathic gout, unspecified ankle and foot: Secondary | ICD-10-CM

## 2014-03-12 DIAGNOSIS — Z0000 Encounter for general adult medical examination without abnormal findings: Secondary | ICD-10-CM

## 2014-03-12 DIAGNOSIS — E669 Obesity, unspecified: Secondary | ICD-10-CM

## 2014-03-12 DIAGNOSIS — M109 Gout, unspecified: Secondary | ICD-10-CM

## 2014-03-12 DIAGNOSIS — Z125 Encounter for screening for malignant neoplasm of prostate: Secondary | ICD-10-CM

## 2014-03-12 LAB — BASIC METABOLIC PANEL
BUN: 12 mg/dL (ref 6–23)
CO2: 26 mEq/L (ref 19–32)
Calcium: 9.2 mg/dL (ref 8.4–10.5)
Chloride: 103 mEq/L (ref 96–112)
Creatinine, Ser: 1 mg/dL (ref 0.4–1.5)
GFR: 89.17 mL/min (ref >60.00)
Glucose, Bld: 97 mg/dL (ref 70–99)
POTASSIUM: 4.2 meq/L (ref 3.5–5.1)
Sodium: 137 mEq/L (ref 135–145)

## 2014-03-12 LAB — CBC WITH DIFFERENTIAL/PLATELET
BASOS PCT: 0.5 % (ref 0.0–3.0)
Basophils Absolute: 0 10*3/uL (ref 0.0–0.1)
EOS ABS: 0.1 10*3/uL (ref 0.0–0.7)
Eosinophils Relative: 2.3 % (ref 0.0–5.0)
HCT: 46.1 % (ref 39.0–52.0)
Hemoglobin: 15.4 g/dL (ref 13.0–17.0)
LYMPHS PCT: 32.2 % (ref 12.0–46.0)
Lymphs Abs: 2 10*3/uL (ref 0.7–4.0)
MCHC: 33.4 g/dL (ref 30.0–36.0)
MCV: 87.3 fl (ref 78.0–100.0)
MONOS PCT: 9.5 % (ref 3.0–12.0)
Monocytes Absolute: 0.6 10*3/uL (ref 0.1–1.0)
Neutro Abs: 3.5 10*3/uL (ref 1.4–7.7)
Neutrophils Relative %: 55.5 % (ref 43.0–77.0)
Platelets: 234 10*3/uL (ref 150.0–400.0)
RBC: 5.28 Mil/uL (ref 4.22–5.81)
RDW: 13.2 % (ref 11.5–15.5)
WBC: 6.4 10*3/uL (ref 4.0–10.5)

## 2014-03-12 LAB — HEPATIC FUNCTION PANEL
ALBUMIN: 4.5 g/dL (ref 3.5–5.2)
ALT: 60 U/L — ABNORMAL HIGH (ref 0–53)
AST: 40 U/L — ABNORMAL HIGH (ref 0–37)
Alkaline Phosphatase: 49 U/L (ref 39–117)
Bilirubin, Direct: 0.1 mg/dL (ref 0.0–0.3)
Total Bilirubin: 0.8 mg/dL (ref 0.2–1.2)
Total Protein: 7.4 g/dL (ref 6.0–8.3)

## 2014-03-12 LAB — LIPID PANEL
CHOL/HDL RATIO: 4
Cholesterol: 163 mg/dL (ref 0–200)
HDL: 38.4 mg/dL — AB (ref >39.00)
LDL CALC: 85 mg/dL (ref 0–99)
NonHDL: 124.6
Triglycerides: 200 mg/dL — ABNORMAL HIGH (ref 0.0–149.0)
VLDL: 40 mg/dL (ref 0.0–40.0)

## 2014-03-12 LAB — TSH: TSH: 0.96 u[IU]/mL (ref 0.35–4.50)

## 2014-03-12 LAB — PSA: PSA: 0.16 ng/mL (ref 0.10–4.00)

## 2014-03-12 NOTE — Progress Notes (Signed)
Pre visit review using our clinic review tool, if applicable. No additional management support is needed unless otherwise documented below in the visit note. 

## 2014-03-12 NOTE — Progress Notes (Addendum)
Subjective:    [name], male    DOB: Aug 06, 1965, 48 y.o.   MRN: [id number]  HPI 48 year old male with past history of recurring allergy problems and hypertension who comes in today to follow up on these issues as well as for a complete physical exam.   Here to f/u regarding his blood pressure.  States he is doing well.  No significant allergy problems reported.  Overall feels good.  No cardiac symptoms with increased activity or exertion.  States his blood pressure has been averaging 125-144/70-90s.  Has a new cuff.  His cuff is reading higher than our readings here in the office.  Checked a few times.        Past Medical History  Diagnosis Date  . Hypertension   . Environmental allergies   . Nephrolithiasis     Current Outpatient Prescriptions on File Prior to Visit  Medication Sig Dispense Refill  . fish oil-omega-3 fatty acids 1000 MG capsule Take 1 g by mouth daily.    . fluticasone (FLONASE) 50 MCG/ACT nasal spray Place 2 sprays into both nostrils daily. 16 g 2  . lisinopril (PRINIVIL,ZESTRIL) 10 MG tablet TAKE ONE TABLET BY MOUTH EVERY DAY 90 tablet 0  . metoprolol tartrate (LOPRESSOR) 25 MG tablet Take 1 tablet (25 mg total) by mouth 2 (two) times daily. 60 tablet 4  . Multiple Vitamin (MULTIVITAMIN) tablet Take 1 tablet by mouth daily.     No current facility-administered medications on file prior to visit.    Review of Systems Patient denies any headache, lightheadedness or dizziness.  Allergy symptoms controlled.  No chest pain, tightness or palpitations.  No increased shortness of breath, cough or congestion.  No nausea or vomiting.  No acid reflux.  No abdominal pain or cramping.  No bowel change, such as diarrhea, constipation, BRBPR or melana.  No urine change.  Blood pressure as outlined.  Overall he feels he is doing well.  Tries to stay active.  Discussed importance of diet and exercise.       Objective:   Physical Exam  Filed Vitals:   03/12/14 0933  BP: 124/80  Pulse: 61  Temp: 97.9 F (36.6 C)   Blood pressure recheck: 83/67  48 year old male in no acute distress.  HEENT:  Nares - clear.  Oropharynx - without lesions. NECK:  Supple.  Nontender.  No audible carotid bruit.  HEART:  Appears to be regular.   LUNGS:  No crackles or wheezing audible.  Respirations even and unlabored.   RADIAL PULSE:  Equal bilaterally.  ABDOMEN:  Soft.  Nontender.  Bowel sounds present and normal.  No audible abdominal bruit.  GU:  Normal descended testicles.  No palpable testicular nodules.   RECTAL:  Could not appreciate any palpable prostate nodules.  Heme negative.   EXTREMITIES:  No increased edema present.  DP pulses palpable and equal bilaterally.         Assessment & Plan:  1. Essential hypertension Blood pressure on our checks under good control.  Same medication regimen.  Follow pressures.   - Basic metabolic panel - Lipid panel  2. Gout of foot, unspecified cause, unspecified chronicity, unspecified laterality Asymptomatic.    3. Anemia, unspecified anemia type Recheck cbc today.    4. Abnormal liver function Has declined ultrasound.  Check liver panel.  - Hepatic function panel  5. Leukopenia - CBC with Differential - TSH  6. Prostate cancer screening - PSA  7.  Obesity (BMI 30-39.9) Diet and exercise.    HEALTH MAINTENANCE.  Physical today.  PSA 03/08/13 - .18.  Check f/u psa.     I spent 25 minutes with the patient and more than 50% of the time was spent in consultation regarding the above.    ADDENDUM:  This was a visit for his routine  physical and his medical problems we addressed were stable.

## 2014-03-14 ENCOUNTER — Telehealth: Payer: Self-pay | Admitting: Internal Medicine

## 2014-03-14 ENCOUNTER — Encounter: Payer: Self-pay | Admitting: Internal Medicine

## 2014-03-14 DIAGNOSIS — R7989 Other specified abnormal findings of blood chemistry: Secondary | ICD-10-CM

## 2014-03-14 DIAGNOSIS — R945 Abnormal results of liver function studies: Secondary | ICD-10-CM

## 2014-03-14 NOTE — Telephone Encounter (Signed)
Pt notified of lab results via my chart.  Needs non fasting lab within the next 2 weeks.  Please schedule and contact pt with an appt date and time.  Thanks.

## 2014-03-18 ENCOUNTER — Encounter: Payer: Self-pay | Admitting: Internal Medicine

## 2014-03-18 DIAGNOSIS — Z6841 Body Mass Index (BMI) 40.0 and over, adult: Secondary | ICD-10-CM | POA: Insufficient documentation

## 2014-03-29 ENCOUNTER — Encounter: Payer: Self-pay | Admitting: Internal Medicine

## 2014-03-29 ENCOUNTER — Other Ambulatory Visit (INDEPENDENT_AMBULATORY_CARE_PROVIDER_SITE_OTHER): Payer: No Typology Code available for payment source

## 2014-03-29 DIAGNOSIS — R945 Abnormal results of liver function studies: Secondary | ICD-10-CM

## 2014-03-29 DIAGNOSIS — R7989 Other specified abnormal findings of blood chemistry: Secondary | ICD-10-CM

## 2014-03-29 LAB — HEPATIC FUNCTION PANEL
ALK PHOS: 47 U/L (ref 39–117)
ALT: 52 U/L (ref 0–53)
AST: 34 U/L (ref 0–37)
Albumin: 4.3 g/dL (ref 3.5–5.2)
BILIRUBIN DIRECT: 0.1 mg/dL (ref 0.0–0.3)
BILIRUBIN TOTAL: 0.5 mg/dL (ref 0.2–1.2)
Total Protein: 7.5 g/dL (ref 6.0–8.3)

## 2014-04-08 ENCOUNTER — Telehealth: Payer: Self-pay

## 2014-04-08 NOTE — Telephone Encounter (Signed)
The patient called asking that his visit on March 19, 2014 be re-coded as a wellness exam.  He stated he is having problems with insurance due to it being billed the way it is now.

## 2014-04-08 NOTE — Telephone Encounter (Signed)
I am ok to change this to a wellness exam.  Who do I need to forward this to?  Thanks.

## 2014-05-27 ENCOUNTER — Encounter: Payer: Self-pay | Admitting: Internal Medicine

## 2014-05-27 NOTE — Telephone Encounter (Signed)
Per Lorriane Shire, you will need to make an addendum to the visit to add the code for his physical. The note is fine, but not coded properly. Please let Lorriane Shire know when this is done. Thanks

## 2014-05-27 NOTE — Addendum Note (Signed)
Addended by: Alisa Graff on: 05/27/2014 01:14 PM   Modules accepted: Miquel Dunn

## 2014-05-27 NOTE — Telephone Encounter (Signed)
I have placed an addendum on the note.

## 2014-06-20 ENCOUNTER — Other Ambulatory Visit: Payer: Self-pay | Admitting: Internal Medicine

## 2014-06-25 NOTE — Telephone Encounter (Signed)
Billing notified.

## 2014-09-03 ENCOUNTER — Other Ambulatory Visit: Payer: Self-pay | Admitting: Internal Medicine

## 2014-09-03 NOTE — Telephone Encounter (Signed)
Appt 09/17/14

## 2014-09-17 ENCOUNTER — Ambulatory Visit (INDEPENDENT_AMBULATORY_CARE_PROVIDER_SITE_OTHER): Payer: Managed Care, Other (non HMO) | Admitting: Internal Medicine

## 2014-09-17 ENCOUNTER — Encounter: Payer: Self-pay | Admitting: Internal Medicine

## 2014-09-17 ENCOUNTER — Other Ambulatory Visit: Payer: Self-pay | Admitting: Internal Medicine

## 2014-09-17 VITALS — BP 102/70 | HR 70 | Temp 98.5°F | Ht 72.0 in | Wt 280.5 lb

## 2014-09-17 DIAGNOSIS — E7800 Pure hypercholesterolemia, unspecified: Secondary | ICD-10-CM

## 2014-09-17 DIAGNOSIS — E669 Obesity, unspecified: Secondary | ICD-10-CM

## 2014-09-17 DIAGNOSIS — R7989 Other specified abnormal findings of blood chemistry: Secondary | ICD-10-CM

## 2014-09-17 DIAGNOSIS — R739 Hyperglycemia, unspecified: Secondary | ICD-10-CM

## 2014-09-17 DIAGNOSIS — D649 Anemia, unspecified: Secondary | ICD-10-CM

## 2014-09-17 DIAGNOSIS — K7689 Other specified diseases of liver: Secondary | ICD-10-CM | POA: Diagnosis not present

## 2014-09-17 DIAGNOSIS — R945 Abnormal results of liver function studies: Secondary | ICD-10-CM

## 2014-09-17 DIAGNOSIS — Z0000 Encounter for general adult medical examination without abnormal findings: Secondary | ICD-10-CM | POA: Insufficient documentation

## 2014-09-17 DIAGNOSIS — I10 Essential (primary) hypertension: Secondary | ICD-10-CM | POA: Diagnosis not present

## 2014-09-17 LAB — BASIC METABOLIC PANEL
BUN: 15 mg/dL (ref 6–23)
CALCIUM: 9.3 mg/dL (ref 8.4–10.5)
CHLORIDE: 104 meq/L (ref 96–112)
CO2: 26 mEq/L (ref 19–32)
CREATININE: 1.02 mg/dL (ref 0.40–1.50)
GFR: 81.97 mL/min (ref >60.00)
Glucose, Bld: 113 mg/dL — ABNORMAL HIGH (ref 70–99)
Potassium: 4.1 mEq/L (ref 3.5–5.1)
Sodium: 138 mEq/L (ref 135–145)

## 2014-09-17 LAB — HEPATIC FUNCTION PANEL
ALBUMIN: 4.5 g/dL (ref 3.5–5.2)
ALT: 55 U/L — ABNORMAL HIGH (ref 0–53)
AST: 37 U/L (ref 0–37)
Alkaline Phosphatase: 45 U/L (ref 39–117)
Bilirubin, Direct: 0.1 mg/dL (ref 0.0–0.3)
Total Bilirubin: 0.6 mg/dL (ref 0.2–1.2)
Total Protein: 7.4 g/dL (ref 6.0–8.3)

## 2014-09-17 LAB — LIPID PANEL
CHOLESTEROL: 136 mg/dL (ref 0–200)
HDL: 38.7 mg/dL — ABNORMAL LOW (ref >39.00)
LDL CALC: 61 mg/dL (ref 0–99)
NonHDL: 97.3
TRIGLYCERIDES: 180 mg/dL — AB (ref 0.0–149.0)
Total CHOL/HDL Ratio: 4
VLDL: 36 mg/dL (ref 0.0–40.0)

## 2014-09-17 MED ORDER — LISINOPRIL 10 MG PO TABS: 10.0000 mg | ORAL_TABLET | Freq: Every day | ORAL | Status: DC

## 2014-09-17 MED ORDER — METOPROLOL TARTRATE 25 MG PO TABS: 25.0000 mg | ORAL_TABLET | Freq: Two times a day (BID) | ORAL | Status: DC

## 2014-09-17 NOTE — Assessment & Plan Note (Signed)
Blood pressure doing well.  Same medication regimen.  Check metabolic panel.

## 2014-09-17 NOTE — Assessment & Plan Note (Signed)
Last cbc in 02/2014 - wnl.

## 2014-09-17 NOTE — Assessment & Plan Note (Signed)
Has declined abdominal ultrasound.  Recheck liver panel.

## 2014-09-17 NOTE — Assessment & Plan Note (Signed)
Diet and exercise.   

## 2014-09-17 NOTE — Assessment & Plan Note (Signed)
Physical 03/12/14.  PSA .16 - 03/12/14.

## 2014-09-17 NOTE — Progress Notes (Signed)
Order placed for f/u labs.  

## 2014-09-17 NOTE — Progress Notes (Signed)
Pre visit review using our clinic review tool, if applicable. No additional management support is needed unless otherwise documented below in the visit note. 

## 2014-09-17 NOTE — Progress Notes (Signed)
[name], male   DOB: 26-Mar-1965, 49 y.o.   MRN: [id number]   Subjective:    [name], male    DOB: Feb 04, 1966, 49 y.o.   MRN: [id number]  HPI  Patient here for a scheduled follow up.  Walking some.  No cardiac symptoms with increased activity or exertion.  Breathing stable.  Discussed diet and exercise.  Bowels stable.  Increased stress recently.  Better now.  Handling stress well.     Past Medical History  Diagnosis Date  . Hypertension   . Environmental allergies   . Nephrolithiasis     Current Outpatient Prescriptions on File Prior to Visit  Medication Sig Dispense Refill  . fish oil-omega-3 fatty acids 1000 MG capsule Take 1 g by mouth daily.    . fluticasone (FLONASE) 50 MCG/ACT nasal spray Place 2 sprays into both nostrils daily. 16 g 2  . Multiple Vitamin (MULTIVITAMIN) tablet Take 1 tablet by mouth daily.     No current facility-administered medications on file prior to visit.    Review of Systems  Constitutional: Negative for appetite change and unexpected weight change.  HENT: Negative for congestion and sinus pressure.   Respiratory: Negative for cough, chest tightness and shortness of breath.   Cardiovascular: Negative for chest pain, palpitations and leg swelling.  Gastrointestinal: Negative for nausea, vomiting, abdominal pain and diarrhea.  Neurological: Negative for dizziness, light-headedness and headaches.  Psychiatric/Behavioral: Negative for dysphoric mood and agitation.       Objective:     Blood pressure recheck:  120/78  Physical Exam  Constitutional: He appears well-developed and well-nourished. No distress.  HENT:  Nose: Nose normal.  Mouth/Throat: Oropharynx is clear and moist.  Neck: Neck supple. No thyromegaly present.  Cardiovascular: Normal rate and regular rhythm.   Pulmonary/Chest: Effort normal and breath sounds normal. No respiratory distress.  Abdominal: Soft. Bowel sounds are normal. There is  no tenderness.  Musculoskeletal: He exhibits no edema or tenderness.  Lymphadenopathy:    He has no cervical adenopathy.  Psychiatric: He has a normal mood and affect. His behavior is normal.    BP 102/70 mmHg  Pulse 70  Temp(Src) 98.5 F (36.9 C) (Oral)  Ht 6' (1.829 m)  Wt 280 lb 8 oz (127.234 kg)  BMI 38.03 kg/m2  SpO2 93% Wt Readings from Last 3 Encounters:  09/17/14 280 lb 8 oz (127.234 kg)  03/12/14 281 lb 8 oz (127.688 kg)  11/13/13 276 lb 8 oz (125.42 kg)     Lab Results  Component Value Date   WBC 6.4 03/12/2014   HGB 15.4 03/12/2014   HCT 46.1 03/12/2014   PLT 234.0 03/12/2014   GLUCOSE 97 03/12/2014   CHOL 163 03/12/2014   TRIG 200.0* 03/12/2014   HDL 38.40* 03/12/2014   LDLCALC 85 03/12/2014   ALT 52 03/29/2014   AST 34 03/29/2014   NA 137 03/12/2014   K 4.2 03/12/2014   CL 103 03/12/2014   CREATININE 1.0 03/12/2014   BUN 12 03/12/2014   CO2 26 03/12/2014   TSH 0.96 03/12/2014   PSA 0.16 03/12/2014       Assessment & Plan:   Problem List Items Addressed This Visit    Abnormal liver function    Has declined abdominal ultrasound.  Recheck liver panel.       Relevant Orders   Hepatic function panel   Anemia    Last cbc in 02/2014 - wnl.  Healthcare maintenance    Physical 03/12/14.  PSA .16 - 03/12/14.        Hypertension - Primary    Blood pressure doing well.  Same medication regimen.  Check metabolic panel.       Relevant Medications   lisinopril (PRINIVIL,ZESTRIL) 10 MG tablet   metoprolol tartrate (LOPRESSOR) 25 MG tablet   Other Relevant Orders   Basic metabolic panel   Obesity (BMI 30-39.9)    Diet and exercise.         Other Visit Diagnoses    Hypercholesterolemia        Relevant Medications    lisinopril (PRINIVIL,ZESTRIL) 10 MG tablet    metoprolol tartrate (LOPRESSOR) 25 MG tablet    Other Relevant Orders    Lipid panel        Einar Pheasant, MD

## 2014-10-15 ENCOUNTER — Other Ambulatory Visit (INDEPENDENT_AMBULATORY_CARE_PROVIDER_SITE_OTHER): Payer: Managed Care, Other (non HMO)

## 2014-10-15 DIAGNOSIS — R7989 Other specified abnormal findings of blood chemistry: Secondary | ICD-10-CM | POA: Diagnosis not present

## 2014-10-15 DIAGNOSIS — R739 Hyperglycemia, unspecified: Secondary | ICD-10-CM

## 2014-10-15 DIAGNOSIS — R945 Abnormal results of liver function studies: Secondary | ICD-10-CM

## 2014-10-15 LAB — HEPATIC FUNCTION PANEL
ALBUMIN: 4.4 g/dL (ref 3.5–5.2)
ALT: 61 U/L — ABNORMAL HIGH (ref 0–53)
AST: 38 U/L — ABNORMAL HIGH (ref 0–37)
Alkaline Phosphatase: 45 U/L (ref 39–117)
Bilirubin, Direct: 0.2 mg/dL (ref 0.0–0.3)
Total Bilirubin: 0.6 mg/dL (ref 0.2–1.2)
Total Protein: 7.4 g/dL (ref 6.0–8.3)

## 2014-10-15 LAB — HEMOGLOBIN A1C: HEMOGLOBIN A1C: 5.6 % (ref 4.6–6.5)

## 2014-10-16 LAB — GLUCOSE, FASTING: Glucose, Fasting: 106 mg/dL — ABNORMAL HIGH (ref 65–99)

## 2014-10-17 ENCOUNTER — Encounter: Payer: Self-pay | Admitting: Internal Medicine

## 2015-03-19 ENCOUNTER — Encounter: Payer: Self-pay | Admitting: Internal Medicine

## 2015-03-19 ENCOUNTER — Ambulatory Visit (INDEPENDENT_AMBULATORY_CARE_PROVIDER_SITE_OTHER): Payer: Managed Care, Other (non HMO) | Admitting: Internal Medicine

## 2015-03-19 VITALS — BP 135/86 | HR 66 | Temp 98.1°F | Ht 71.25 in | Wt 280.0 lb

## 2015-03-19 DIAGNOSIS — R739 Hyperglycemia, unspecified: Secondary | ICD-10-CM | POA: Diagnosis not present

## 2015-03-19 DIAGNOSIS — Z125 Encounter for screening for malignant neoplasm of prostate: Secondary | ICD-10-CM

## 2015-03-19 DIAGNOSIS — Z0000 Encounter for general adult medical examination without abnormal findings: Secondary | ICD-10-CM

## 2015-03-19 DIAGNOSIS — D72819 Decreased white blood cell count, unspecified: Secondary | ICD-10-CM | POA: Diagnosis not present

## 2015-03-19 DIAGNOSIS — E669 Obesity, unspecified: Secondary | ICD-10-CM

## 2015-03-19 DIAGNOSIS — R945 Abnormal results of liver function studies: Secondary | ICD-10-CM

## 2015-03-19 DIAGNOSIS — Z13220 Encounter for screening for lipoid disorders: Secondary | ICD-10-CM | POA: Diagnosis not present

## 2015-03-19 DIAGNOSIS — I10 Essential (primary) hypertension: Secondary | ICD-10-CM

## 2015-03-19 DIAGNOSIS — K7689 Other specified diseases of liver: Secondary | ICD-10-CM | POA: Diagnosis not present

## 2015-03-19 DIAGNOSIS — D649 Anemia, unspecified: Secondary | ICD-10-CM

## 2015-03-19 DIAGNOSIS — Z1211 Encounter for screening for malignant neoplasm of colon: Secondary | ICD-10-CM

## 2015-03-19 LAB — CBC WITH DIFFERENTIAL/PLATELET
Basophils Absolute: 0 10*3/uL (ref 0.0–0.1)
Basophils Relative: 0.4 % (ref 0.0–3.0)
EOS PCT: 2.3 % (ref 0.0–5.0)
Eosinophils Absolute: 0.1 10*3/uL (ref 0.0–0.7)
HEMATOCRIT: 46.2 % (ref 39.0–52.0)
Hemoglobin: 15.5 g/dL (ref 13.0–17.0)
LYMPHS ABS: 1.8 10*3/uL (ref 0.7–4.0)
LYMPHS PCT: 29 % (ref 12.0–46.0)
MCHC: 33.6 g/dL (ref 30.0–36.0)
MCV: 87.6 fl (ref 78.0–100.0)
MONOS PCT: 8.5 % (ref 3.0–12.0)
Monocytes Absolute: 0.5 10*3/uL (ref 0.1–1.0)
NEUTROS ABS: 3.6 10*3/uL (ref 1.4–7.7)
NEUTROS PCT: 59.8 % (ref 43.0–77.0)
Platelets: 246 10*3/uL (ref 150.0–400.0)
RBC: 5.28 Mil/uL (ref 4.22–5.81)
RDW: 13 % (ref 11.5–15.5)
WBC: 6.1 10*3/uL (ref 4.0–10.5)

## 2015-03-19 LAB — BASIC METABOLIC PANEL
BUN: 14 mg/dL (ref 6–23)
CALCIUM: 9.6 mg/dL (ref 8.4–10.5)
CO2: 27 mEq/L (ref 19–32)
Chloride: 103 mEq/L (ref 96–112)
Creatinine, Ser: 0.97 mg/dL (ref 0.40–1.50)
GFR: 86.7 mL/min (ref >60.00)
Glucose, Bld: 95 mg/dL (ref 70–99)
POTASSIUM: 4.2 meq/L (ref 3.5–5.1)
SODIUM: 140 meq/L (ref 135–145)

## 2015-03-19 LAB — LIPID PANEL
CHOL/HDL RATIO: 3
Cholesterol: 145 mg/dL (ref 0–200)
HDL: 44.7 mg/dL (ref >39.00)
LDL CALC: 64 mg/dL (ref 0–99)
NONHDL: 100.18
TRIGLYCERIDES: 181 mg/dL — AB (ref 0.0–149.0)
VLDL: 36.2 mg/dL (ref 0.0–40.0)

## 2015-03-19 LAB — HEPATIC FUNCTION PANEL
ALBUMIN: 4.5 g/dL (ref 3.5–5.2)
ALK PHOS: 48 U/L (ref 39–117)
ALT: 37 U/L (ref 0–53)
AST: 24 U/L (ref 0–37)
Bilirubin, Direct: 0.1 mg/dL (ref 0.0–0.3)
TOTAL PROTEIN: 7.2 g/dL (ref 6.0–8.3)
Total Bilirubin: 0.6 mg/dL (ref 0.2–1.2)

## 2015-03-19 LAB — HEMOGLOBIN A1C: HEMOGLOBIN A1C: 5.7 % (ref 4.6–6.5)

## 2015-03-19 LAB — PSA: PSA: 0.12 ng/mL (ref 0.10–4.00)

## 2015-03-19 LAB — TSH: TSH: 1.02 u[IU]/mL (ref 0.35–4.50)

## 2015-03-19 NOTE — Progress Notes (Signed)
[name], male   DOB: 1965-08-06, 49 y.o.   MRN: [id number]   Subjective:    [name], male    DOB: August 31, 1965, 49 y.o.   MRN: [id number]  HPI  Patient with past history of allergies and hypertension.  He comes in today to follow up on these issues as well as for a complete physical exam.  He is doing well.  Some increased stress with planning his son's wedding.  Feels he is handling things well.  Tries to stay active.  We discussed diet and exercise.  No chest pain or tightness.  No sob.  No acid reflux.  No abdominal pain or cramping.  States due for colonoscopy.  Would like to have before 06/2015.  Discussed elevated liver enzymes.  Has been hesitant to get abdominal ultrasound.  Will check liver panel today.     Past Medical History  Diagnosis Date  . Hypertension   . Environmental allergies   . Nephrolithiasis    No past surgical history on file. Family History  Problem Relation Age of Onset  . Hypertension Father   . Hypertension Mother   . Diabetes Mother   . Down syndrome Sister   . Prostate cancer Neg Hx   . Colon cancer Neg Hx    Social History   Social History  . Marital Status: Married    Spouse Name: N/A  . Number of Children: 2  . Years of Education: N/A   Social History Main Topics  . Smoking status: Never Smoker   . Smokeless tobacco: Never Used  . Alcohol Use: No  . Drug Use: No  . Sexual Activity: Not Asked   Other Topics Concern  . None   Social History Narrative    Outpatient Encounter Prescriptions as of 03/19/2015  Medication Sig  . fish oil-omega-3 fatty acids 1000 MG capsule Take 1 g by mouth daily.  . fluticasone (FLONASE) 50 MCG/ACT nasal spray Place 2 sprays into both nostrils daily.  Marland Kitchen lisinopril (PRINIVIL,ZESTRIL) 10 MG tablet Take 1 tablet (10 mg total) by mouth daily.  . metoprolol tartrate (LOPRESSOR) 25 MG tablet Take 1 tablet (25 mg total) by mouth 2 (two) times daily.  . Multiple Vitamin  (MULTIVITAMIN) tablet Take 1 tablet by mouth daily.   No facility-administered encounter medications on file as of 03/19/2015.    Review of Systems  Constitutional: Negative for appetite change and unexpected weight change.  HENT: Negative for congestion and sinus pressure.   Eyes: Negative for pain and visual disturbance.  Respiratory: Negative for cough, chest tightness and shortness of breath.   Cardiovascular: Negative for chest pain, palpitations and leg swelling.  Gastrointestinal: Negative for nausea, vomiting, abdominal pain and diarrhea.  Genitourinary: Negative for dysuria and difficulty urinating.  Musculoskeletal: Negative for back pain and joint swelling.  Skin: Negative for color change and rash.  Neurological: Negative for dizziness, light-headedness and headaches.  Hematological: Negative for adenopathy. Does not bruise/bleed easily.  Psychiatric/Behavioral: Negative for dysphoric mood and agitation.       Objective:     Blood pressure rechecked by me:  128/84  Physical Exam  Constitutional: He is oriented to person, place, and time. He appears well-developed and well-nourished. No distress.  HENT:  Head: Normocephalic and atraumatic.  Nose: Nose normal.  Mouth/Throat: Oropharynx is clear and moist. No oropharyngeal exudate.  Eyes: Conjunctivae are normal. Right eye exhibits no discharge. Left eye exhibits no discharge.  Neck: Neck supple. No  thyromegaly present.  Cardiovascular: Normal rate and regular rhythm.   Pulmonary/Chest: Breath sounds normal. No respiratory distress. He has no wheezes.  Abdominal: Soft. Bowel sounds are normal. There is no tenderness.  Genitourinary:  Rectal exam - no palpable prostate nodule.  Heme negative.    Musculoskeletal: He exhibits no edema or tenderness.  Lymphadenopathy:    He has no cervical adenopathy.  Neurological: He is alert and oriented to person, place, and time.  Skin: Skin is warm and dry. No rash noted. No  erythema.  Psychiatric: He has a normal mood and affect. His behavior is normal.    BP 135/86 mmHg  Pulse 66  Temp(Src) 98.1 F (36.7 C) (Oral)  Ht 5' 11.25" (1.81 m)  Wt 280 lb (127.007 kg)  BMI 38.77 kg/m2  SpO2 97% Wt Readings from Last 3 Encounters:  03/19/15 280 lb (127.007 kg)  09/17/14 280 lb 8 oz (127.234 kg)  03/12/14 281 lb 8 oz (127.688 kg)     Lab Results  Component Value Date   WBC 6.1 03/19/2015   HGB 15.5 03/19/2015   HCT 46.2 03/19/2015   PLT 246.0 03/19/2015   GLUCOSE 95 03/19/2015   CHOL 145 03/19/2015   TRIG 181.0* 03/19/2015   HDL 44.70 03/19/2015   LDLCALC 64 03/19/2015   ALT 37 03/19/2015   AST 24 03/19/2015   NA 140 03/19/2015   K 4.2 03/19/2015   CL 103 03/19/2015   CREATININE 0.97 03/19/2015   BUN 14 03/19/2015   CO2 27 03/19/2015   TSH 1.02 03/19/2015   PSA 0.12 03/19/2015   HGBA1C 5.7 03/19/2015       Assessment & Plan:   Problem List Items Addressed This Visit    Abnormal liver function    Recheck liver panel today.  If persistent elevation, will check abdominal ultrasound.        Relevant Orders   Hepatic function panel (Completed)   Anemia    Has a history of anemia.  Last cbc wnl.  Recheck today to confirm stable.       Healthcare maintenance    Physical today 03/19/15.  Check psa.  States due colonoscopy.  Refer to GI.        Hypertension - Primary    Blood pressure under good control.  Continue same medication regimen.  Follow pressures.  Follow metabolic panel.        Relevant Orders   Basic metabolic panel (Completed)   Leukopenia           Relevant Orders   CBC with Differential/Platelet (Completed)   TSH (Completed)   Obesity (BMI 30-39.9)    Diet and exercise.  Follow.         Other Visit Diagnoses    Hyperglycemia        Relevant Orders    Hemoglobin A1c (Completed)    Screening cholesterol level        Relevant Orders    Lipid panel (Completed)    Prostate cancer screening        Relevant  Orders    PSA (Completed)    Colon cancer screening        Relevant Orders    Ambulatory referral to Gastroenterology        Derek Pheasant, MD

## 2015-03-19 NOTE — Assessment & Plan Note (Signed)
Blood pressure under good control.  Continue same medication regimen.  Follow pressures.  Follow metabolic panel.   

## 2015-03-19 NOTE — Assessment & Plan Note (Signed)
Physical today 03/19/15.  Check psa.  States due colonoscopy.  Refer to GI.

## 2015-03-19 NOTE — Progress Notes (Signed)
Pre visit review using our clinic review tool, if applicable. No additional management support is needed unless otherwise documented below in the visit note. 

## 2015-03-19 NOTE — Assessment & Plan Note (Signed)
Recheck liver panel today.  If persistent elevation, will check abdominal ultrasound.

## 2015-03-19 NOTE — Assessment & Plan Note (Signed)
Has a history of anemia.  Last cbc wnl.  Recheck today to confirm stable.

## 2015-03-19 NOTE — Assessment & Plan Note (Signed)
Diet and exercise.  Follow.  

## 2015-03-20 ENCOUNTER — Other Ambulatory Visit: Payer: Self-pay | Admitting: Internal Medicine

## 2015-03-20 DIAGNOSIS — R945 Abnormal results of liver function studies: Secondary | ICD-10-CM

## 2015-03-20 DIAGNOSIS — R7989 Other specified abnormal findings of blood chemistry: Secondary | ICD-10-CM

## 2015-03-20 NOTE — Progress Notes (Signed)
Abdominal ultrasound ordered.

## 2015-03-26 ENCOUNTER — Ambulatory Visit: Payer: Managed Care, Other (non HMO)

## 2015-03-31 ENCOUNTER — Encounter: Payer: Self-pay | Admitting: Internal Medicine

## 2015-03-31 ENCOUNTER — Ambulatory Visit
Admission: RE | Admit: 2015-03-31 | Discharge: 2015-03-31 | Disposition: A | Discharge status: Home / Self Care | Payer: Managed Care, Other (non HMO) | Source: Ambulatory Visit | Attending: Internal Medicine | Admitting: Internal Medicine

## 2015-03-31 DIAGNOSIS — R7989 Other specified abnormal findings of blood chemistry: Secondary | ICD-10-CM | POA: Diagnosis present

## 2015-03-31 DIAGNOSIS — R945 Abnormal results of liver function studies: Secondary | ICD-10-CM

## 2015-03-31 DIAGNOSIS — K760 Fatty (change of) liver, not elsewhere classified: Secondary | ICD-10-CM | POA: Diagnosis not present

## 2015-04-14 ENCOUNTER — Encounter: Payer: Self-pay | Admitting: Internal Medicine

## 2015-05-13 ENCOUNTER — Encounter: Payer: Self-pay | Admitting: Internal Medicine

## 2015-05-14 ENCOUNTER — Encounter: Payer: Self-pay | Admitting: Internal Medicine

## 2015-05-14 ENCOUNTER — Telehealth: Payer: Self-pay | Admitting: Internal Medicine

## 2015-05-14 MED ORDER — AMOXICILLIN 875 MG PO TABS: 875.0000 mg | ORAL_TABLET | Freq: Two times a day (BID) | ORAL | Status: DC

## 2015-05-14 NOTE — Telephone Encounter (Signed)
Spoke to Mr Kennard on the phone

## 2015-05-14 NOTE — Telephone Encounter (Signed)
Spoke with the patient, he has been having sinus pressure, drainage is yellow out of his nasal passage.  No fevers, chills, cough or stomach related issues.  Please advise. Thanks

## 2015-05-14 NOTE — Telephone Encounter (Signed)
Already addressed

## 2015-05-14 NOTE — Telephone Encounter (Signed)
Spoke to pt and sent my chart message for treatment.

## 2015-05-14 NOTE — Telephone Encounter (Signed)
Pt called about having a sinus infection/nasal mucus. Pt states his mom just passed away and he cannot come in for an appt. Pt would like something to be called in. Pharmacy is [id number] - [place] [address]), [place] - [place]. Call pt @ [phone]. Thank you!

## 2015-05-16 ENCOUNTER — Encounter: Admission: RE | Payer: Self-pay | Source: Ambulatory Visit

## 2015-05-16 ENCOUNTER — Ambulatory Visit
Admission: RE | Admit: 2015-05-16 | Payer: Managed Care, Other (non HMO) | Source: Ambulatory Visit | Admitting: Unknown Physician Specialty

## 2015-05-16 SURGERY — COLONOSCOPY WITH PROPOFOL
Anesthesia: General

## 2015-06-16 ENCOUNTER — Ambulatory Visit: Payer: Managed Care, Other (non HMO) | Admitting: Anesthesiology

## 2015-06-16 ENCOUNTER — Encounter
Admission: RE | Disposition: A | Discharge status: Home / Self Care | Payer: Self-pay | Source: Ambulatory Visit | Attending: Unknown Physician Specialty

## 2015-06-16 ENCOUNTER — Ambulatory Visit
Admission: RE | Admit: 2015-06-16 | Discharge: 2015-06-16 | Disposition: A | Discharge status: Home / Self Care | Payer: Managed Care, Other (non HMO) | Source: Ambulatory Visit | Attending: Unknown Physician Specialty | Admitting: Unknown Physician Specialty

## 2015-06-16 ENCOUNTER — Encounter: Payer: Self-pay | Admitting: Anesthesiology

## 2015-06-16 DIAGNOSIS — D125 Benign neoplasm of sigmoid colon: Secondary | ICD-10-CM | POA: Diagnosis not present

## 2015-06-16 DIAGNOSIS — Z8279 Family history of other congenital malformations, deformations and chromosomal abnormalities: Secondary | ICD-10-CM | POA: Insufficient documentation

## 2015-06-16 DIAGNOSIS — I10 Essential (primary) hypertension: Secondary | ICD-10-CM | POA: Insufficient documentation

## 2015-06-16 DIAGNOSIS — Z79899 Other long term (current) drug therapy: Secondary | ICD-10-CM | POA: Diagnosis not present

## 2015-06-16 DIAGNOSIS — Z1211 Encounter for screening for malignant neoplasm of colon: Secondary | ICD-10-CM | POA: Insufficient documentation

## 2015-06-16 DIAGNOSIS — X58XXXA Exposure to other specified factors, initial encounter: Secondary | ICD-10-CM | POA: Diagnosis not present

## 2015-06-16 DIAGNOSIS — Z8371 Family history of colonic polyps: Secondary | ICD-10-CM | POA: Diagnosis not present

## 2015-06-16 DIAGNOSIS — T7840XA Allergy, unspecified, initial encounter: Secondary | ICD-10-CM | POA: Insufficient documentation

## 2015-06-16 DIAGNOSIS — Z87442 Personal history of urinary calculi: Secondary | ICD-10-CM | POA: Insufficient documentation

## 2015-06-16 DIAGNOSIS — Z8249 Family history of ischemic heart disease and other diseases of the circulatory system: Secondary | ICD-10-CM | POA: Diagnosis not present

## 2015-06-16 DIAGNOSIS — Z833 Family history of diabetes mellitus: Secondary | ICD-10-CM | POA: Insufficient documentation

## 2015-06-16 DIAGNOSIS — K640 First degree hemorrhoids: Secondary | ICD-10-CM | POA: Insufficient documentation

## 2015-06-16 DIAGNOSIS — Z7951 Long term (current) use of inhaled steroids: Secondary | ICD-10-CM | POA: Insufficient documentation

## 2015-06-16 HISTORY — PX: COLONOSCOPY WITH PROPOFOL: SHX5780

## 2015-06-16 SURGERY — COLONOSCOPY WITH PROPOFOL
Anesthesia: General

## 2015-06-16 MED ORDER — SODIUM CHLORIDE 0.9 % IV SOLN
INTRAVENOUS | Status: DC
  Administered 2015-06-16: 15:00:00 via INTRAVENOUS

## 2015-06-16 MED ORDER — MIDAZOLAM HCL 5 MG/5ML IJ SOLN
INTRAMUSCULAR | Status: DC | PRN
  Administered 2015-06-16: 2 mg via INTRAVENOUS

## 2015-06-16 MED ORDER — FENTANYL CITRATE (PF) 100 MCG/2ML IJ SOLN
INTRAMUSCULAR | Status: DC | PRN
  Administered 2015-06-16: 50 ug via INTRAVENOUS

## 2015-06-16 MED ORDER — PROPOFOL 10 MG/ML IV BOLUS
INTRAVENOUS | Status: DC | PRN
  Administered 2015-06-16: 30 mg via INTRAVENOUS
  Administered 2015-06-16: 50 mg via INTRAVENOUS

## 2015-06-16 MED ORDER — SODIUM CHLORIDE 0.9 % IV SOLN
INTRAVENOUS | Status: DC
  Administered 2015-06-16: 1000 mL via INTRAVENOUS

## 2015-06-16 MED ORDER — PROPOFOL 500 MG/50ML IV EMUL
INTRAVENOUS | Status: DC | PRN
  Administered 2015-06-16: 60 ug/kg/min via INTRAVENOUS

## 2015-06-16 NOTE — Anesthesia Preprocedure Evaluation (Signed)
Anesthesia Evaluation  Patient identified by MRN, date of birth, ID band Patient awake    Reviewed: Allergy & Precautions, NPO status , Patient's Chart, lab work & pertinent test results, reviewed documented beta blocker date and time   Airway Mallampati: III  TM Distance: <3 FB Neck ROM: Full    Dental   Pulmonary neg pulmonary ROS,    Pulmonary exam normal breath sounds clear to auscultation       Cardiovascular hypertension, Pt. on medications and Pt. on home beta blockers Normal cardiovascular exam     Neuro/Psych negative neurological ROS  negative psych ROS   GI/Hepatic negative GI ROS, Neg liver ROS,   Endo/Other  negative endocrine ROS  Renal/GU stones  negative genitourinary   Musculoskeletal Sacral pain   Abdominal Normal abdominal exam  (+)   Peds negative pediatric ROS (+)  Hematology  (+) anemia ,   Anesthesia Other Findings gout  Reproductive/Obstetrics                             Anesthesia Physical Anesthesia Plan  ASA: II  Anesthesia Plan: General   Post-op Pain Management:    Induction: Intravenous  Airway Management Planned: Nasal Cannula  Additional Equipment:   Intra-op Plan:   Post-operative Plan:   Informed Consent: I have reviewed the patients History and Physical, chart, labs and discussed the procedure including the risks, benefits and alternatives for the proposed anesthesia with the patient or authorized representative who has indicated his/her understanding and acceptance.   Dental advisory given  Plan Discussed with: CRNA and Surgeon  Anesthesia Plan Comments:         Anesthesia Quick Evaluation

## 2015-06-16 NOTE — Op Note (Signed)
[facility]astroenter[facility] Patient Name: Derek Griffith Procedure Date: 06/16/2015 3:13 PM MRN: [id number] Account #: [id number] Date of Birth: 1965-10-08 Admit Type: Outpatient Age: 50 Room: [facility] ENDO ROOM 1 Gender: Male Note Status: Finalized Procedure:            Colonoscopy Indications:          Colon cancer screening in patient at increased risk:                        Family history of 1st-degree relative with colon polyps Providers:            Manya Silvas, MD Referring MD:         Einar Pheasant, MD (Referring MD) Medicines:            Propofol per Anesthesia Complications:        No immediate complications. Procedure:            Pre-Anesthesia Assessment:                       - After reviewing the risks and benefits, the patient                        was deemed in satisfactory condition to undergo the                        procedure.                       After obtaining informed consent, the colonoscope was                        passed under direct vision. Throughout the procedure,                        the patient's blood pressure, pulse, and oxygen                        saturations were monitored continuously. The                        Colonoscope was introduced through the anus and                        advanced to the the cecum, identified by appendiceal                        orifice and ileocecal valve. The colonoscopy was                        performed without difficulty. The patient tolerated the                        procedure well. The quality of the bowel preparation                        was good. Findings:      A diminutive polyp was found in the sigmoid colon. The polyp was       sessile. The polyp was removed with a jumbo cold forceps. Resection and       retrieval were complete.  Internal hemorrhoids were found during endoscopy. The hemorrhoids were       small and Grade I (internal hemorrhoids that do not  prolapse).      The exam was otherwise without abnormality. Impression:           - One diminutive polyp in the sigmoid colon, removed                        with a jumbo cold forceps. Resected and retrieved.                       - Internal hemorrhoids.                       - The examination was otherwise normal. Recommendation:       - Await pathology results. Manya Silvas, MD 06/16/2015 3:43:07 PM This report has been signed electronically. Number of Addenda: 0 Note Initiated On: 06/16/2015 3:13 PM Scope Withdrawal Time: 0 hours 13 minutes 30 seconds  Total Procedure Duration: 0 hours 22 minutes 12 seconds       [facility]

## 2015-06-16 NOTE — H&P (Signed)
   Primary Care Physician:  Einar Pheasant, MD Primary Gastroenterologist:  Dr. Vira Agar  Pre-Procedure History & Physical: HPI:  Derek Griffith is a 50 y.o. male is here for an colonoscopy.   Past Medical History  Diagnosis Date  . Hypertension   . Environmental allergies   . Nephrolithiasis     History reviewed. No pertinent past surgical history.  Prior to Admission medications   Medication Sig Start Date End Date Taking? Authorizing Provider  lisinopril (PRINIVIL,ZESTRIL) 10 MG tablet Take 1 tablet (10 mg total) by mouth daily. 09/17/14  Yes Einar Pheasant, MD  metoprolol tartrate (LOPRESSOR) 25 MG tablet Take 1 tablet (25 mg total) by mouth 2 (two) times daily. 09/17/14  Yes Einar Pheasant, MD  amoxicillin (AMOXIL) 875 MG tablet Take 1 tablet (875 mg total) by mouth 2 (two) times daily. Do not fill unless pt calls for rx Patient not taking: Reported on 06/16/2015 05/14/15   Einar Pheasant, MD  fish oil-omega-3 fatty acids 1000 MG capsule Take 1 g by mouth daily.    Historical Provider, MD  fluticasone (FLONASE) 50 MCG/ACT nasal spray Place 2 sprays into both nostrils daily. 03/08/13   Einar Pheasant, MD  Multiple Vitamin (MULTIVITAMIN) tablet Take 1 tablet by mouth daily.    Historical Provider, MD    Allergies as of 06/02/2015 - Review Complete 03/19/2015  Allergen Reaction Noted  . No known drug allergy  02/29/2012    Family History  Problem Relation Age of Onset  . Hypertension Father   . Hypertension Mother   . Diabetes Mother   . Down syndrome Sister   . Prostate cancer Neg Hx   . Colon cancer Neg Hx     Social History   Social History  . Marital Status: Married    Spouse Name: N/A  . Number of Children: 2  . Years of Education: N/A   Occupational History  . Not on file.   Social History Main Topics  . Smoking status: Never Smoker   . Smokeless tobacco: Never Used  . Alcohol Use: No  . Drug Use: No  . Sexual Activity: Not on file   Other Topics  Concern  . Not on file   Social History Narrative    Review of Systems: See HPI, otherwise negative ROS  Physical Exam: BP 114/85 mmHg  Pulse 72  Temp(Src) 98 F (36.7 C) (Tympanic)  Resp 20  Ht 6' (1.829 m)  Wt 122.471 kg (270 lb)  BMI 36.61 kg/m2  SpO2 98% General:   Alert,  pleasant and cooperative in NAD Head:  Normocephalic and atraumatic. Neck:  Supple; no masses or thyromegaly. Lungs:  Clear throughout to auscultation.    Heart:  Regular rate and rhythm. Abdomen:  Soft, nontender and nondistended. Normal bowel sounds, without guarding, and without rebound.   Neurologic:  Alert and  oriented x4;  grossly normal neurologically.  Impression/Plan: Derek Griffith is here for an colonoscopy to be performed for FH colon polyps in mother  Risks, benefits, limitations, and alternatives regarding  colonoscopy have been reviewed with the patient.  Questions have been answered.  All parties agreeable.   Gaylyn Cheers, MD  06/16/2015, 3:10 PM

## 2015-06-16 NOTE — Transfer of Care (Signed)
Immediate Anesthesia Transfer of Care Note  Patient: Derek Griffith  Procedure(s) Performed: Procedure(s): COLONOSCOPY WITH PROPOFOL (N/A)  Patient Location: PACU  Anesthesia Type:General  Level of Consciousness: awake  Airway & Oxygen Therapy: Patient Spontanous Breathing and Patient connected to nasal cannula oxygen  Post-op Assessment: Report given to RN and Post -op Vital signs reviewed and stable  Post vital signs: Reviewed and stable  Last Vitals:  Filed Vitals:   06/16/15 1431 06/16/15 1550  BP: 114/85 88/45  Pulse: 72 66  Temp: 36.7 C 36.2 C  Resp: 20 18    Complications: No apparent anesthesia complications

## 2015-06-16 NOTE — Anesthesia Postprocedure Evaluation (Signed)
Anesthesia Post Note  Patient: Derek Griffith  Procedure(s) Performed: Procedure(s) (LRB): COLONOSCOPY WITH PROPOFOL (N/A)  Patient location during evaluation: PACU Anesthesia Type: General Level of consciousness: awake Pain management: pain level controlled Vital Signs Assessment: post-procedure vital signs reviewed and stable Respiratory status: spontaneous breathing Cardiovascular status: blood pressure returned to baseline Postop Assessment: no headache Anesthetic complications: no    Last Vitals:  Filed Vitals:   06/16/15 1431 06/16/15 1550  BP: 114/85 88/45  Pulse: 72 66  Temp: 36.7 C 36.2 C  Resp: 20 18    Last Pain: There were no vitals filed for this visit.               Buckner Malta

## 2015-06-16 NOTE — Anesthesia Procedure Notes (Signed)
Procedure Name: MAC Date/Time: 06/16/2015 3:14 PM Performed by: Allean Found Pre-anesthesia Checklist: Patient identified, Emergency Drugs available, Suction available, Patient being monitored and Timeout performed Patient Re-evaluated:Patient Re-evaluated prior to inductionOxygen Delivery Method: Nasal cannula Intubation Type: IV induction

## 2015-06-17 ENCOUNTER — Encounter: Payer: Self-pay | Admitting: Unknown Physician Specialty

## 2015-06-18 LAB — SURGICAL PATHOLOGY

## 2015-08-19 ENCOUNTER — Encounter: Payer: Self-pay | Admitting: Internal Medicine

## 2015-08-19 DIAGNOSIS — Z860100 Personal history of colon polyps, unspecified: Secondary | ICD-10-CM | POA: Insufficient documentation

## 2015-09-17 ENCOUNTER — Encounter: Payer: Self-pay | Admitting: Internal Medicine

## 2015-09-17 ENCOUNTER — Ambulatory Visit (INDEPENDENT_AMBULATORY_CARE_PROVIDER_SITE_OTHER): Payer: Managed Care, Other (non HMO) | Admitting: Internal Medicine

## 2015-09-17 VITALS — BP 118/80 | HR 56 | Temp 97.8°F | Resp 18 | Ht 71.25 in | Wt 279.1 lb

## 2015-09-17 DIAGNOSIS — Z91048 Other nonmedicinal substance allergy status: Secondary | ICD-10-CM | POA: Diagnosis not present

## 2015-09-17 DIAGNOSIS — I10 Essential (primary) hypertension: Secondary | ICD-10-CM

## 2015-09-17 DIAGNOSIS — Z860100 Personal history of colon polyps, unspecified: Secondary | ICD-10-CM

## 2015-09-17 DIAGNOSIS — R945 Abnormal results of liver function studies: Secondary | ICD-10-CM

## 2015-09-17 DIAGNOSIS — R739 Hyperglycemia, unspecified: Secondary | ICD-10-CM | POA: Diagnosis not present

## 2015-09-17 DIAGNOSIS — E669 Obesity, unspecified: Secondary | ICD-10-CM

## 2015-09-17 DIAGNOSIS — K7689 Other specified diseases of liver: Secondary | ICD-10-CM

## 2015-09-17 DIAGNOSIS — Z9109 Other allergy status, other than to drugs and biological substances: Secondary | ICD-10-CM

## 2015-09-17 LAB — BASIC METABOLIC PANEL
BUN: 13 mg/dL (ref 6–23)
CHLORIDE: 103 meq/L (ref 96–112)
CO2: 30 meq/L (ref 19–32)
CREATININE: 0.94 mg/dL (ref 0.40–1.50)
Calcium: 9.7 mg/dL (ref 8.4–10.5)
GFR: 89.72 mL/min (ref >60.00)
GLUCOSE: 107 mg/dL — AB (ref 70–99)
Potassium: 4.4 mEq/L (ref 3.5–5.1)
Sodium: 139 mEq/L (ref 135–145)

## 2015-09-17 LAB — HEPATIC FUNCTION PANEL
ALT: 46 U/L (ref 0–53)
AST: 32 U/L (ref 0–37)
Albumin: 4.6 g/dL (ref 3.5–5.2)
Alkaline Phosphatase: 44 U/L (ref 39–117)
BILIRUBIN DIRECT: 0.1 mg/dL (ref 0.0–0.3)
BILIRUBIN TOTAL: 0.6 mg/dL (ref 0.2–1.2)
Total Protein: 7.6 g/dL (ref 6.0–8.3)

## 2015-09-17 LAB — HEMOGLOBIN A1C: HEMOGLOBIN A1C: 5.5 % (ref 4.6–6.5)

## 2015-09-17 MED ORDER — LISINOPRIL 10 MG PO TABS: 10.0000 mg | ORAL_TABLET | Freq: Every day | ORAL | Status: DC

## 2015-09-17 NOTE — Assessment & Plan Note (Signed)
Controlled on current regimen.   

## 2015-09-17 NOTE — Assessment & Plan Note (Signed)
Blood pressure under good control.  Continue same medication regimen.  Follow pressures.  Follow metabolic panel.   

## 2015-09-17 NOTE — Progress Notes (Signed)
[name], male   DOB: February 04, 1966, 50 y.o.   MRN: [id number]   Subjective:    [name], male    DOB: 24-Sep-1965, 50 y.o.   MRN: [id number]  HPI  Patient here for a scheduled follow up.  Doing well.  Increased stress at work.  Handling things relatively well.  Discussed diet and exercise.  He stays physically active.  No chest pain.  No sob.  Allergies have been controlled.  No acid reflux.  No abdominal pain or cramping.  Bowels stable.     Past Medical History  Diagnosis Date  . Hypertension   . Environmental allergies   . Nephrolithiasis    Past Surgical History  Procedure Laterality Date  . Colonoscopy with propofol N/A 06/16/2015    Procedure: COLONOSCOPY WITH PROPOFOL;  Surgeon: Manya Silvas, MD;  Location: [facility] ENDOSCOPY;  Service: Endoscopy;  Laterality: N/A;   Family History  Problem Relation Age of Onset  . Hypertension Father   . Hypertension Mother   . Diabetes Mother   . Down syndrome Sister   . Prostate cancer Neg Hx   . Colon cancer Neg Hx    Social History   Social History  . Marital Status: Married    Spouse Name: N/A  . Number of Children: 2  . Years of Education: N/A   Social History Main Topics  . Smoking status: Never Smoker   . Smokeless tobacco: Never Used  . Alcohol Use: No  . Drug Use: No  . Sexual Activity: Not Asked   Other Topics Concern  . None   Social History Narrative    Outpatient Encounter Prescriptions as of 09/17/2015  Medication Sig  . fish oil-omega-3 fatty acids 1000 MG capsule Take 1 g by mouth daily.  . fluticasone (FLONASE) 50 MCG/ACT nasal spray Place 2 sprays into both nostrils daily.  Marland Kitchen lisinopril (PRINIVIL,ZESTRIL) 10 MG tablet Take 1 tablet (10 mg total) by mouth daily.  . metoprolol tartrate (LOPRESSOR) 25 MG tablet Take 1 tablet (25 mg total) by mouth 2 (two) times daily.  . Multiple Vitamin (MULTIVITAMIN) tablet Take 1 tablet by mouth daily.  . [DISCONTINUED] lisinopril  (PRINIVIL,ZESTRIL) 10 MG tablet Take 1 tablet (10 mg total) by mouth daily.  . [DISCONTINUED] amoxicillin (AMOXIL) 875 MG tablet Take 1 tablet (875 mg total) by mouth 2 (two) times daily. Do not fill unless pt calls for rx (Patient not taking: Reported on 06/16/2015)   No facility-administered encounter medications on file as of 09/17/2015.    Review of Systems  Constitutional: Negative for appetite change and unexpected weight change.  HENT: Negative for congestion and sinus pressure.   Respiratory: Negative for cough, chest tightness and shortness of breath.   Cardiovascular: Negative for chest pain, palpitations and leg swelling.  Gastrointestinal: Negative for nausea, abdominal pain and diarrhea.  Genitourinary: Negative for dysuria and difficulty urinating.  Musculoskeletal: Negative for back pain and joint swelling.  Skin: Negative for color change and rash.  Neurological: Negative for dizziness, light-headedness and headaches.  Psychiatric/Behavioral: Negative for dysphoric mood and agitation.       Objective:    Physical Exam  Constitutional: He appears well-developed and well-nourished. No distress.  HENT:  Nose: Nose normal.  Mouth/Throat: Oropharynx is clear and moist.  Neck: Neck supple. No thyromegaly present.  Cardiovascular: Normal rate and regular rhythm.   Pulmonary/Chest: Effort normal and breath sounds normal. No respiratory distress.  Abdominal: Soft. Bowel sounds are normal.  There is no tenderness.  Musculoskeletal: He exhibits no edema or tenderness.  Lymphadenopathy:    He has no cervical adenopathy.  Skin: No rash noted. No erythema.  Psychiatric: He has a normal mood and affect. His behavior is normal.    BP 118/80 mmHg  Pulse 56  Temp(Src) 97.8 F (36.6 C) (Oral)  Resp 18  Ht 5' 11.25" (1.81 m)  Wt 279 lb 2 oz (126.61 kg)  BMI 38.65 kg/m2  SpO2 95% Wt Readings from Last 3 Encounters:  09/17/15 279 lb 2 oz (126.61 kg)  06/16/15 270 lb (122.471  kg)  03/19/15 280 lb (127.007 kg)     Lab Results  Component Value Date   WBC 6.1 03/19/2015   HGB 15.5 03/19/2015   HCT 46.2 03/19/2015   PLT 246.0 03/19/2015   GLUCOSE 95 03/19/2015   CHOL 145 03/19/2015   TRIG 181.0* 03/19/2015   HDL 44.70 03/19/2015   LDLCALC 64 03/19/2015   ALT 37 03/19/2015   AST 24 03/19/2015   NA 140 03/19/2015   K 4.2 03/19/2015   CL 103 03/19/2015   CREATININE 0.97 03/19/2015   BUN 14 03/19/2015   CO2 27 03/19/2015   TSH 1.02 03/19/2015   PSA 0.12 03/19/2015   HGBA1C 5.7 03/19/2015       Assessment & Plan:   Problem List Items Addressed This Visit    Abnormal liver function    Ultrasound revealed fatty liver.  Discussed the need for hepatitis vaccines if he has not had.  Recheck liver panel.       Relevant Orders   Hepatic function panel   Environmental allergies    Controlled on current regimen.        History of colonic polyps    Colonoscopy 06/16/15 as outlined.  Recommended f/u colonoscopy in 05/2020.        Hypertension - Primary    Blood pressure under good control.  Continue same medication regimen.  Follow pressures.  Follow metabolic panel.        Relevant Medications   lisinopril (PRINIVIL,ZESTRIL) 10 MG tablet   Other Relevant Orders   Basic metabolic panel   Obesity (BMI 30-39.9)    Diet and exercise.         Other Visit Diagnoses    Hyperglycemia        Relevant Orders    Hemoglobin A1c        Einar Pheasant, MD

## 2015-09-17 NOTE — Assessment & Plan Note (Signed)
Diet and exercise.   

## 2015-09-17 NOTE — Assessment & Plan Note (Signed)
Ultrasound revealed fatty liver.  Discussed the need for hepatitis vaccines if he has not had.  Recheck liver panel.

## 2015-09-17 NOTE — Assessment & Plan Note (Signed)
Colonoscopy 06/16/15 as outlined.  Recommended f/u colonoscopy in 05/2020.

## 2015-09-17 NOTE — Progress Notes (Signed)
Pre-visit discussion using our clinic review tool. No additional management support is needed unless otherwise documented below in the visit note.  

## 2015-09-19 NOTE — Telephone Encounter (Signed)
Unread mychart message mailed to patient 

## 2016-03-24 ENCOUNTER — Ambulatory Visit (INDEPENDENT_AMBULATORY_CARE_PROVIDER_SITE_OTHER): Payer: Managed Care, Other (non HMO) | Admitting: Internal Medicine

## 2016-03-24 ENCOUNTER — Encounter: Payer: Self-pay | Admitting: Internal Medicine

## 2016-03-24 VITALS — BP 134/88 | HR 65 | Temp 97.9°F | Ht 71.0 in | Wt 282.6 lb

## 2016-03-24 DIAGNOSIS — Z860100 Personal history of colon polyps, unspecified: Secondary | ICD-10-CM

## 2016-03-24 DIAGNOSIS — R945 Abnormal results of liver function studies: Secondary | ICD-10-CM

## 2016-03-24 DIAGNOSIS — E669 Obesity, unspecified: Secondary | ICD-10-CM

## 2016-03-24 DIAGNOSIS — K7689 Other specified diseases of liver: Secondary | ICD-10-CM

## 2016-03-24 DIAGNOSIS — Z9109 Other allergy status, other than to drugs and biological substances: Secondary | ICD-10-CM

## 2016-03-24 DIAGNOSIS — Z0000 Encounter for general adult medical examination without abnormal findings: Secondary | ICD-10-CM | POA: Diagnosis not present

## 2016-03-24 DIAGNOSIS — I10 Essential (primary) hypertension: Secondary | ICD-10-CM | POA: Diagnosis not present

## 2016-03-24 DIAGNOSIS — R739 Hyperglycemia, unspecified: Secondary | ICD-10-CM

## 2016-03-24 DIAGNOSIS — Z125 Encounter for screening for malignant neoplasm of prostate: Secondary | ICD-10-CM

## 2016-03-24 MED ORDER — TRIAMCINOLONE ACETONIDE 0.1 % EX CREA: 1.0000 "application " | TOPICAL_CREAM | Freq: Two times a day (BID) | CUTANEOUS | 0 refills | Status: DC

## 2016-03-24 NOTE — Progress Notes (Signed)
Pre visit review using our clinic review tool, if applicable. No additional management support is needed unless otherwise documented below in the visit note. 

## 2016-03-24 NOTE — Assessment & Plan Note (Addendum)
Physical today 03/24/16.  Check psa with next labs.  Colonoscopy 06/16/15 - one diminutive polyp and internal hemorrhoids.  Recommended f/u colonoscopy 05/2020.

## 2016-03-24 NOTE — Progress Notes (Signed)
[name], male   DOB: 07/14/1965, 51 y.o.   MRN: [id number]   Subjective:    [name], male    DOB: March 08, 1966, 51 y.o.   MRN: [id number]  HPI  Patient here for his physical exam.  States he is doing well.  Working a lot.  Handling stress well.  No chest pain.  No sob.  No acid reflux.  No abdominal pain or cramping.  Bowels stable.  Some allergy issues, but overall doing well.  Discussed diet and exercise.  Discussed fatty liver.  Discussed importance of weight loss.  Discussed the need for hepatitis vaccine.  He wants to check with his insurance.     Past Medical History:  Diagnosis Date  . Environmental allergies   . Hypertension   . Nephrolithiasis    Past Surgical History:  Procedure Laterality Date  . COLONOSCOPY WITH PROPOFOL N/A 06/16/2015   Procedure: COLONOSCOPY WITH PROPOFOL;  Surgeon: Manya Silvas, MD;  Location: [facility] ENDOSCOPY;  Service: Endoscopy;  Laterality: N/A;   Family History  Problem Relation Age of Onset  . Hypertension Father   . Hypertension Mother   . Diabetes Mother   . Down syndrome Sister   . Prostate cancer Neg Hx   . Colon cancer Neg Hx    Social History   Social History  . Marital status: Married    Spouse name: N/A  . Number of children: 2  . Years of education: N/A   Social History Main Topics  . Smoking status: Never Smoker  . Smokeless tobacco: Never Used  . Alcohol use No  . Drug use: No  . Sexual activity: Not Asked   Other Topics Concern  . None   Social History Narrative  . None    Outpatient Encounter Prescriptions as of 03/24/2016  Medication Sig  . fish oil-omega-3 fatty acids 1000 MG capsule Take 1 g by mouth daily.  . fluticasone (FLONASE) 50 MCG/ACT nasal spray Place 2 sprays into both nostrils daily.  Marland Kitchen lisinopril (PRINIVIL,ZESTRIL) 10 MG tablet Take 1 tablet (10 mg total) by mouth daily.  . metoprolol tartrate (LOPRESSOR) 25 MG tablet Take 1 tablet (25 mg total) by mouth 2  (two) times daily.  . Multiple Vitamin (MULTIVITAMIN) tablet Take 1 tablet by mouth daily.  Marland Kitchen triamcinolone cream (KENALOG) 0.1 % Apply 1 application topically 2 (two) times daily.   No facility-administered encounter medications on file as of 03/24/2016.     Review of Systems  Constitutional: Negative for appetite change and unexpected weight change.  HENT: Negative for congestion and sinus pressure.   Eyes: Negative for pain and visual disturbance.  Respiratory: Negative for cough, chest tightness and shortness of breath.   Cardiovascular: Negative for chest pain, palpitations and leg swelling.  Gastrointestinal: Negative for abdominal pain, diarrhea, nausea and vomiting.  Genitourinary: Negative for difficulty urinating and dysuria.  Musculoskeletal: Negative for back pain and joint swelling.  Skin: Negative for color change and rash.  Neurological: Negative for dizziness, light-headedness and headaches.  Hematological: Negative for adenopathy. Does not bruise/bleed easily.  Psychiatric/Behavioral: Negative for agitation and dysphoric mood.       Objective:     Blood pressure rechecked by me:  134/84  Physical Exam  Constitutional: He is oriented to person, place, and time. He appears well-developed and well-nourished. No distress.  HENT:  Head: Normocephalic and atraumatic.  Nose: Nose normal.  Mouth/Throat: Oropharynx is clear and moist. No oropharyngeal exudate.  Eyes: Conjunctivae are normal. Right eye exhibits no discharge. Left eye exhibits no discharge.  Neck: Neck supple. No thyromegaly present.  Cardiovascular: Normal rate and regular rhythm.   Pulmonary/Chest: Breath sounds normal. No respiratory distress. He has no wheezes.  Abdominal: Soft. Bowel sounds are normal. There is no tenderness.  Genitourinary:  Genitourinary Comments: Rectal exam - no palpable prostate nodules.  Heme negative.   Musculoskeletal: He exhibits no edema or tenderness.  Lymphadenopathy:     He has no cervical adenopathy.  Neurological: He is alert and oriented to person, place, and time.  Skin: Skin is warm and dry. No erythema.  Dry erythematous rash noted - upper extremities (bilateral).  Few circular patches - lower abdomen.    Psychiatric: He has a normal mood and affect. His behavior is normal.    BP 134/88   Pulse 65   Temp 97.9 F (36.6 C) (Oral)   Ht 5\' 11"  (1.803 m)   Wt 282 lb 9.6 oz (128.2 kg)   SpO2 95%   BMI 39.41 kg/m  Wt Readings from Last 3 Encounters:  03/24/16 282 lb 9.6 oz (128.2 kg)  09/17/15 279 lb 2 oz (126.6 kg)  06/16/15 270 lb (122.5 kg)     Lab Results  Component Value Date   WBC 6.1 03/19/2015   HGB 15.5 03/19/2015   HCT 46.2 03/19/2015   PLT 246.0 03/19/2015   GLUCOSE 107 (H) 09/17/2015   CHOL 145 03/19/2015   TRIG 181.0 (H) 03/19/2015   HDL 44.70 03/19/2015   LDLCALC 64 03/19/2015   ALT 46 09/17/2015   AST 32 09/17/2015   NA 139 09/17/2015   K 4.4 09/17/2015   CL 103 09/17/2015   CREATININE 0.94 09/17/2015   BUN 13 09/17/2015   CO2 30 09/17/2015   TSH 1.02 03/19/2015   PSA 0.12 03/19/2015   HGBA1C 5.5 09/17/2015       Assessment & Plan:   Problem List Items Addressed This Visit    Abnormal liver function    Ultrasound revealed fatty liver.  Discussed the need for hepatitis vaccines.  He wants to hold.  Will notify me if agreeable.  Follow liver function tests.        Relevant Orders   Hepatic function panel   Environmental allergies    Stable on current regimen.        Healthcare maintenance    Physical today 03/24/16.  Check psa with next labs.  Colonoscopy 06/16/15 - one diminutive polyp and internal hemorrhoids.  Recommended f/u colonoscopy 05/2020.        History of colonic polyps    Colonoscopy 05/2015.  Recommended f/u colonoscopy 05/2020.        Hypertension    Blood pressure has been under good control.  Same medication regimen.  Follow pressures.  Follow metabolic panel.       Relevant Orders   CBC  with Differential/Platelet   Lipid panel   TSH   Basic metabolic panel   Obesity (BMI 30-39.9)    Diet and exercise.  Follow.        Other Visit Diagnoses    Prostate cancer screening    -  Primary   Relevant Orders   PSA   Hyperglycemia       Relevant Orders   Hemoglobin A1c       Einar Pheasant, MD

## 2016-03-28 ENCOUNTER — Encounter: Payer: Self-pay | Admitting: Internal Medicine

## 2016-03-28 NOTE — Assessment & Plan Note (Signed)
Diet and exercise.  Follow.  

## 2016-03-28 NOTE — Assessment & Plan Note (Signed)
Colonoscopy 05/2015.  Recommended f/u colonoscopy 05/2020.  °

## 2016-03-28 NOTE — Assessment & Plan Note (Signed)
Stable on current regimen   

## 2016-03-28 NOTE — Assessment & Plan Note (Signed)
Ultrasound revealed fatty liver.  Discussed the need for hepatitis vaccines.  He wants to hold.  Will notify me if agreeable.  Follow liver function tests.

## 2016-03-28 NOTE — Assessment & Plan Note (Signed)
Blood pressure has been under good control.  Same medication regimen.  Follow pressures.   Follow metabolic panel.   

## 2016-04-12 ENCOUNTER — Encounter: Payer: Self-pay | Admitting: Internal Medicine

## 2016-04-16 ENCOUNTER — Other Ambulatory Visit (INDEPENDENT_AMBULATORY_CARE_PROVIDER_SITE_OTHER): Payer: Managed Care, Other (non HMO)

## 2016-04-16 DIAGNOSIS — R945 Abnormal results of liver function studies: Secondary | ICD-10-CM

## 2016-04-16 DIAGNOSIS — R739 Hyperglycemia, unspecified: Secondary | ICD-10-CM | POA: Diagnosis not present

## 2016-04-16 DIAGNOSIS — K7689 Other specified diseases of liver: Secondary | ICD-10-CM | POA: Diagnosis not present

## 2016-04-16 DIAGNOSIS — I10 Essential (primary) hypertension: Secondary | ICD-10-CM | POA: Diagnosis not present

## 2016-04-16 DIAGNOSIS — Z125 Encounter for screening for malignant neoplasm of prostate: Secondary | ICD-10-CM | POA: Diagnosis not present

## 2016-04-16 LAB — LIPID PANEL
CHOLESTEROL: 147 mg/dL (ref 0–200)
HDL: 42.5 mg/dL (ref >39.00)
LDL Cholesterol: 73 mg/dL (ref 0–99)
NonHDL: 104.44
TRIGLYCERIDES: 158 mg/dL — AB (ref 0.0–149.0)
Total CHOL/HDL Ratio: 3
VLDL: 31.6 mg/dL (ref 0.0–40.0)

## 2016-04-16 LAB — HEPATIC FUNCTION PANEL
ALBUMIN: 4.7 g/dL (ref 3.5–5.2)
ALT: 59 U/L — ABNORMAL HIGH (ref 0–53)
AST: 34 U/L (ref 0–37)
Alkaline Phosphatase: 45 U/L (ref 39–117)
Bilirubin, Direct: 0.1 mg/dL (ref 0.0–0.3)
TOTAL PROTEIN: 7.4 g/dL (ref 6.0–8.3)
Total Bilirubin: 0.6 mg/dL (ref 0.2–1.2)

## 2016-04-16 LAB — BASIC METABOLIC PANEL
BUN: 16 mg/dL (ref 6–23)
CALCIUM: 9.4 mg/dL (ref 8.4–10.5)
CHLORIDE: 104 meq/L (ref 96–112)
CO2: 29 meq/L (ref 19–32)
Creatinine, Ser: 0.99 mg/dL (ref 0.40–1.50)
GFR: 84.32 mL/min (ref >60.00)
Glucose, Bld: 107 mg/dL — ABNORMAL HIGH (ref 70–99)
Potassium: 4.5 mEq/L (ref 3.5–5.1)
SODIUM: 140 meq/L (ref 135–145)

## 2016-04-16 LAB — CBC WITH DIFFERENTIAL/PLATELET
BASOS PCT: 0.4 % (ref 0.0–3.0)
Basophils Absolute: 0 10*3/uL (ref 0.0–0.1)
EOS ABS: 0.1 10*3/uL (ref 0.0–0.7)
Eosinophils Relative: 1.5 % (ref 0.0–5.0)
HEMATOCRIT: 44.8 % (ref 39.0–52.0)
Hemoglobin: 15.4 g/dL (ref 13.0–17.0)
LYMPHS PCT: 31.1 % (ref 12.0–46.0)
Lymphs Abs: 1.5 10*3/uL (ref 0.7–4.0)
MCHC: 34.4 g/dL (ref 30.0–36.0)
MCV: 86.2 fl (ref 78.0–100.0)
MONOS PCT: 7.6 % (ref 3.0–12.0)
Monocytes Absolute: 0.4 10*3/uL (ref 0.1–1.0)
NEUTROS ABS: 2.9 10*3/uL (ref 1.4–7.7)
Neutrophils Relative %: 59.4 % (ref 43.0–77.0)
PLATELETS: 229 10*3/uL (ref 150.0–400.0)
RBC: 5.2 Mil/uL (ref 4.22–5.81)
RDW: 13.1 % (ref 11.5–15.5)
WBC: 4.9 10*3/uL (ref 4.0–10.5)

## 2016-04-16 LAB — PSA: PSA: 0.13 ng/mL (ref 0.10–4.00)

## 2016-04-16 LAB — TSH: TSH: 1.18 u[IU]/mL (ref 0.35–4.50)

## 2016-04-16 LAB — HEMOGLOBIN A1C: HEMOGLOBIN A1C: 5.5 % (ref 4.6–6.5)

## 2016-04-19 ENCOUNTER — Other Ambulatory Visit: Payer: Self-pay | Admitting: Internal Medicine

## 2016-04-19 DIAGNOSIS — R945 Abnormal results of liver function studies: Secondary | ICD-10-CM

## 2016-04-19 NOTE — Progress Notes (Signed)
Order placed for f/u liver panel.  

## 2016-05-12 ENCOUNTER — Other Ambulatory Visit (INDEPENDENT_AMBULATORY_CARE_PROVIDER_SITE_OTHER): Payer: Managed Care, Other (non HMO)

## 2016-05-12 DIAGNOSIS — K7689 Other specified diseases of liver: Secondary | ICD-10-CM

## 2016-05-12 DIAGNOSIS — R945 Abnormal results of liver function studies: Secondary | ICD-10-CM

## 2016-05-12 LAB — HEPATIC FUNCTION PANEL
ALT: 52 U/L (ref 0–53)
AST: 33 U/L (ref 0–37)
Albumin: 4.5 g/dL (ref 3.5–5.2)
Alkaline Phosphatase: 45 U/L (ref 39–117)
BILIRUBIN DIRECT: 0.1 mg/dL (ref 0.0–0.3)
BILIRUBIN TOTAL: 0.4 mg/dL (ref 0.2–1.2)
Total Protein: 7.3 g/dL (ref 6.0–8.3)

## 2016-05-13 ENCOUNTER — Encounter: Payer: Self-pay | Admitting: Internal Medicine

## 2016-08-17 ENCOUNTER — Other Ambulatory Visit: Payer: Self-pay | Admitting: Internal Medicine

## 2016-09-28 ENCOUNTER — Ambulatory Visit: Payer: Managed Care, Other (non HMO) | Admitting: Internal Medicine

## 2016-10-14 ENCOUNTER — Other Ambulatory Visit: Payer: Self-pay | Admitting: Internal Medicine

## 2017-01-31 ENCOUNTER — Other Ambulatory Visit: Payer: Self-pay | Admitting: Internal Medicine

## 2017-05-31 ENCOUNTER — Other Ambulatory Visit: Payer: Self-pay | Admitting: Internal Medicine

## 2017-08-19 ENCOUNTER — Ambulatory Visit (INDEPENDENT_AMBULATORY_CARE_PROVIDER_SITE_OTHER): Payer: Managed Care, Other (non HMO) | Admitting: Internal Medicine

## 2017-08-19 VITALS — BP 132/84 | HR 70 | Temp 97.7°F | Resp 18 | Ht 71.0 in | Wt 291.8 lb

## 2017-08-19 DIAGNOSIS — Z9109 Other allergy status, other than to drugs and biological substances: Secondary | ICD-10-CM | POA: Diagnosis not present

## 2017-08-19 DIAGNOSIS — R739 Hyperglycemia, unspecified: Secondary | ICD-10-CM

## 2017-08-19 DIAGNOSIS — Z0000 Encounter for general adult medical examination without abnormal findings: Secondary | ICD-10-CM

## 2017-08-19 DIAGNOSIS — Z125 Encounter for screening for malignant neoplasm of prostate: Secondary | ICD-10-CM

## 2017-08-19 DIAGNOSIS — Z6841 Body Mass Index (BMI) 40.0 and over, adult: Secondary | ICD-10-CM

## 2017-08-19 DIAGNOSIS — D649 Anemia, unspecified: Secondary | ICD-10-CM | POA: Diagnosis not present

## 2017-08-19 DIAGNOSIS — R945 Abnormal results of liver function studies: Secondary | ICD-10-CM

## 2017-08-19 DIAGNOSIS — I10 Essential (primary) hypertension: Secondary | ICD-10-CM

## 2017-08-19 MED ORDER — LISINOPRIL 10 MG PO TABS: 10.0000 mg | ORAL_TABLET | Freq: Every day | ORAL | 3 refills | Status: DC

## 2017-08-19 MED ORDER — METOPROLOL TARTRATE 25 MG PO TABS: ORAL_TABLET | ORAL | 1 refills | Status: DC

## 2017-08-19 NOTE — Progress Notes (Signed)
[name], male   DOB: 1965-05-21, 52 y.o.   MRN: [id number]   Subjective:    [name], male    DOB: 04-21-1965, 52 y.o.   MRN: [id number]  HPI  Patient here for his physical exam.  States he is doing well.  Feels good.  Trying to stay active.  Not exercising regularly.  No chest pain.  No sob.  No acid reflux.  No abdominal pain.  Bowels moving.  No urine change.  Handling stress.  Blood pressures overall have been doing relatively well.     Past Medical History:  Diagnosis Date  . Environmental allergies   . Hypertension   . Nephrolithiasis    Past Surgical History:  Procedure Laterality Date  . COLONOSCOPY WITH PROPOFOL N/A 06/16/2015   Procedure: COLONOSCOPY WITH PROPOFOL;  Surgeon: Manya Silvas, MD;  Location: [facility] ENDOSCOPY;  Service: Endoscopy;  Laterality: N/A;   Family History  Problem Relation Age of Onset  . Hypertension Father   . Hypertension Mother   . Diabetes Mother   . Down syndrome Sister   . Prostate cancer Neg Hx   . Colon cancer Neg Hx    Social History   Socioeconomic History  . Marital status: Married    Spouse name: Not on file  . Number of children: 2  . Years of education: Not on file  . Highest education level: Not on file  Occupational History  . Not on file  Social Needs  . Financial resource strain: Not on file  . Food insecurity:    Worry: Not on file    Inability: Not on file  . Transportation needs:    Medical: Not on file    Non-medical: Not on file  Tobacco Use  . Smoking status: Never Smoker  . Smokeless tobacco: Never Used  Substance and Sexual Activity  . Alcohol use: No    Alcohol/week: 0.0 oz  . Drug use: No  . Sexual activity: Not on file  Lifestyle  . Physical activity:    Days per week: Not on file    Minutes per session: Not on file  . Stress: Not on file  Relationships  . Social connections:    Talks on phone: Not on file    Gets together: Not on file    Attends  religious service: Not on file    Active member of club or organization: Not on file    Attends meetings of clubs or organizations: Not on file    Relationship status: Not on file  Other Topics Concern  . Not on file  Social History Narrative  . Not on file    Outpatient Encounter Medications as of 08/19/2017  Medication Sig  . fish oil-omega-3 fatty acids 1000 MG capsule Take 1 g by mouth daily.  . fluticasone (FLONASE) 50 MCG/ACT nasal spray Place 2 sprays into both nostrils daily.  Marland Kitchen lisinopril (PRINIVIL,ZESTRIL) 10 MG tablet Take 1 tablet (10 mg total) by mouth daily.  . metoprolol tartrate (LOPRESSOR) 25 MG tablet TAKE 1 TABLET BY MOUTH TWICE DAILY.  . Multiple Vitamin (MULTIVITAMIN) tablet Take 1 tablet by mouth daily.  Marland Kitchen triamcinolone cream (KENALOG) 0.1 % Apply 1 application topically 2 (two) times daily.  . [DISCONTINUED] lisinopril (PRINIVIL,ZESTRIL) 10 MG tablet TAKE ONE TABLET BY MOUTH ONCE DAILY  . [DISCONTINUED] metoprolol tartrate (LOPRESSOR) 25 MG tablet TAKE 1 TABLET BY MOUTH TWICE DAILY. NEEDS AN APPOINTMENT FOR FURTHER REFILLS (EVERY 6 MONTHS).  No facility-administered encounter medications on file as of 08/19/2017.     Review of Systems  Constitutional: Negative for appetite change and unexpected weight change.  HENT: Negative for congestion and sinus pressure.   Eyes: Negative for pain and visual disturbance.  Respiratory: Negative for cough, chest tightness and shortness of breath.   Cardiovascular: Negative for chest pain, palpitations and leg swelling.  Gastrointestinal: Negative for abdominal pain, diarrhea, nausea and vomiting.  Genitourinary: Negative for difficulty urinating and dysuria.  Musculoskeletal: Negative for joint swelling and myalgias.  Skin: Negative for color change and rash.  Neurological: Negative for dizziness, light-headedness and headaches.  Hematological: Negative for adenopathy. Does not bruise/bleed easily.  Psychiatric/Behavioral:  Negative for agitation and dysphoric mood.       Objective:     Blood pressure rechecked by me:  132/84  Physical Exam  Constitutional: He is oriented to person, place, and time. He appears well-developed and well-nourished. No distress.  HENT:  Head: Normocephalic and atraumatic.  Nose: Nose normal.  Mouth/Throat: Oropharynx is clear and moist. No oropharyngeal exudate.  Eyes: Conjunctivae are normal. Right eye exhibits no discharge. Left eye exhibits no discharge.  Neck: Neck supple. No thyromegaly present.  Cardiovascular: Normal rate and regular rhythm.  Pulmonary/Chest: Breath sounds normal. No respiratory distress. He has no wheezes.  Abdominal: Soft. Bowel sounds are normal. There is no tenderness.  Genitourinary:  Genitourinary Comments: Not performed.    Musculoskeletal: He exhibits no edema or tenderness.  Lymphadenopathy:    He has no cervical adenopathy.  Neurological: He is alert and oriented to person, place, and time.  Skin: No rash noted. No erythema.  Psychiatric: He has a normal mood and affect. His behavior is normal.    BP 132/84   Pulse 70   Temp 97.7 F (36.5 C) (Oral)   Resp 18   Ht 5' 11"  (1.803 m)   Wt 291 lb 12.8 oz (132.4 kg)   SpO2 98%   BMI 40.70 kg/m  Wt Readings from Last 3 Encounters:  08/19/17 291 lb 12.8 oz (132.4 kg)  03/24/16 282 lb 9.6 oz (128.2 kg)  09/17/15 279 lb 2 oz (126.6 kg)     Lab Results  Component Value Date   WBC 4.9 04/16/2016   HGB 15.4 04/16/2016   HCT 44.8 04/16/2016   PLT 229.0 04/16/2016   GLUCOSE 107 (H) 04/16/2016   CHOL 147 04/16/2016   TRIG 158.0 (H) 04/16/2016   HDL 42.50 04/16/2016   LDLCALC 73 04/16/2016   ALT 52 05/12/2016   AST 33 05/12/2016   NA 140 04/16/2016   K 4.5 04/16/2016   CL 104 04/16/2016   CREATININE 0.99 04/16/2016   BUN 16 04/16/2016   CO2 29 04/16/2016   TSH 1.18 04/16/2016   PSA 0.13 04/16/2016   HGBA1C 5.5 04/16/2016       Assessment & Plan:   Problem List Items  Addressed This Visit    Abnormal liver function    Previous ultrasound revealed fatty liver.  He has wanted to hold on hepatitis vaccines.  Follow liver function tests.  Discussed diet and exercise.        Relevant Orders   Hepatic function panel   Anemia    Follow cbc.       Relevant Orders   CBC with Differential/Platelet   BMI 40.0-44.9, adult ([place])    Discussed diet and exercise.  Follow.       Environmental allergies    Stable on current regimen.  Follow.        Hyperglycemia    Low carb diet and exercise.  Follow met b and a1c.       Relevant Orders   Hemoglobin A1c   Hypertension    Blood pressure under good control.  Continue same medication regimen.  Follow pressures.  Follow metabolic panel.        Relevant Medications   lisinopril (PRINIVIL,ZESTRIL) 10 MG tablet   metoprolol tartrate (LOPRESSOR) 25 MG tablet   Other Relevant Orders   Lipid panel   TSH   Basic metabolic panel    Other Visit Diagnoses    Routine general medical examination at a health care facility    -  Primary   Prostate cancer screening       Relevant Orders   PSA       Einar Pheasant, MD

## 2017-08-21 ENCOUNTER — Encounter: Payer: Self-pay | Admitting: Internal Medicine

## 2017-08-21 DIAGNOSIS — E1165 Type 2 diabetes mellitus with hyperglycemia: Secondary | ICD-10-CM | POA: Insufficient documentation

## 2017-08-21 DIAGNOSIS — R739 Hyperglycemia, unspecified: Secondary | ICD-10-CM | POA: Insufficient documentation

## 2017-08-21 NOTE — Assessment & Plan Note (Signed)
Low carb diet and exercise.  Follow met b and a1c.  

## 2017-08-21 NOTE — Assessment & Plan Note (Signed)
Follow cbc.  

## 2017-08-21 NOTE — Assessment & Plan Note (Signed)
Blood pressure under good control.  Continue same medication regimen.  Follow pressures.  Follow metabolic panel.   

## 2017-08-21 NOTE — Assessment & Plan Note (Signed)
Stable on current regimen.  Follow.   

## 2017-08-21 NOTE — Assessment & Plan Note (Signed)
Discussed diet and exercise.  Follow.  

## 2017-08-21 NOTE — Assessment & Plan Note (Signed)
Previous ultrasound revealed fatty liver.  He has wanted to hold on hepatitis vaccines.  Follow liver function tests.  Discussed diet and exercise.

## 2017-08-31 ENCOUNTER — Other Ambulatory Visit (INDEPENDENT_AMBULATORY_CARE_PROVIDER_SITE_OTHER): Payer: Managed Care, Other (non HMO)

## 2017-08-31 DIAGNOSIS — I10 Essential (primary) hypertension: Secondary | ICD-10-CM | POA: Diagnosis not present

## 2017-08-31 DIAGNOSIS — Z125 Encounter for screening for malignant neoplasm of prostate: Secondary | ICD-10-CM

## 2017-08-31 DIAGNOSIS — D649 Anemia, unspecified: Secondary | ICD-10-CM | POA: Diagnosis not present

## 2017-08-31 DIAGNOSIS — R739 Hyperglycemia, unspecified: Secondary | ICD-10-CM

## 2017-08-31 DIAGNOSIS — R945 Abnormal results of liver function studies: Secondary | ICD-10-CM | POA: Diagnosis not present

## 2017-08-31 LAB — CBC WITH DIFFERENTIAL/PLATELET
BASOS ABS: 0.1 10*3/uL (ref 0.0–0.1)
Basophils Relative: 0.9 % (ref 0.0–3.0)
EOS ABS: 0.1 10*3/uL (ref 0.0–0.7)
Eosinophils Relative: 2.1 % (ref 0.0–5.0)
HEMATOCRIT: 43.4 % (ref 39.0–52.0)
Hemoglobin: 15.1 g/dL (ref 13.0–17.0)
LYMPHS PCT: 26.6 % (ref 12.0–46.0)
Lymphs Abs: 1.5 10*3/uL (ref 0.7–4.0)
MCHC: 34.7 g/dL (ref 30.0–36.0)
MCV: 87.5 fl (ref 78.0–100.0)
Monocytes Absolute: 0.6 10*3/uL (ref 0.1–1.0)
Monocytes Relative: 9.7 % (ref 3.0–12.0)
Neutro Abs: 3.5 10*3/uL (ref 1.4–7.7)
Neutrophils Relative %: 60.7 % (ref 43.0–77.0)
Platelets: 228 10*3/uL (ref 150.0–400.0)
RBC: 4.96 Mil/uL (ref 4.22–5.81)
RDW: 13.4 % (ref 11.5–15.5)
WBC: 5.8 10*3/uL (ref 4.0–10.5)

## 2017-08-31 LAB — LIPID PANEL
Cholesterol: 143 mg/dL (ref 0–200)
HDL: 37.9 mg/dL — ABNORMAL LOW
LDL Cholesterol: 74 mg/dL (ref 0–99)
NonHDL: 105.58
Total CHOL/HDL Ratio: 4
Triglycerides: 157 mg/dL — ABNORMAL HIGH (ref 0.0–149.0)
VLDL: 31.4 mg/dL (ref 0.0–40.0)

## 2017-08-31 LAB — HEPATIC FUNCTION PANEL
ALT: 58 U/L — ABNORMAL HIGH (ref 0–53)
AST: 37 U/L (ref 0–37)
Albumin: 4.4 g/dL (ref 3.5–5.2)
Alkaline Phosphatase: 42 U/L (ref 39–117)
Bilirubin, Direct: 0.1 mg/dL (ref 0.0–0.3)
Total Bilirubin: 0.5 mg/dL (ref 0.2–1.2)
Total Protein: 6.8 g/dL (ref 6.0–8.3)

## 2017-08-31 LAB — BASIC METABOLIC PANEL WITH GFR
BUN: 15 mg/dL (ref 6–23)
CO2: 26 meq/L (ref 19–32)
Calcium: 9.3 mg/dL (ref 8.4–10.5)
Chloride: 103 meq/L (ref 96–112)
Creatinine, Ser: 0.96 mg/dL (ref 0.40–1.50)
GFR: 86.91 mL/min
Glucose, Bld: 117 mg/dL — ABNORMAL HIGH (ref 70–99)
Potassium: 4.2 meq/L (ref 3.5–5.1)
Sodium: 138 meq/L (ref 135–145)

## 2017-08-31 LAB — PSA: PSA: 0.2 ng/mL (ref 0.10–4.00)

## 2017-08-31 LAB — TSH: TSH: 1.18 u[IU]/mL (ref 0.35–4.50)

## 2017-08-31 LAB — HEMOGLOBIN A1C: HEMOGLOBIN A1C: 5.9 % (ref 4.6–6.5)

## 2017-09-01 ENCOUNTER — Other Ambulatory Visit: Payer: Self-pay | Admitting: Internal Medicine

## 2017-09-01 DIAGNOSIS — R945 Abnormal results of liver function studies: Secondary | ICD-10-CM

## 2017-09-01 NOTE — Progress Notes (Signed)
Order placed for f/u liver panel.  

## 2017-12-28 IMAGING — US US ABDOMEN COMPLETE
1 series · 14 of 25 positions shown · non-contrast
Comparison: None.

CLINICAL DATA: 49-year-old male with abnormal LFTs. Initial
encounter.

EXAM:
ABDOMEN ULTRASOUND COMPLETE

[Series 1: us abdomen complete · 0.20mm/px · 14 of 85 slices shown]
[im 1/85]
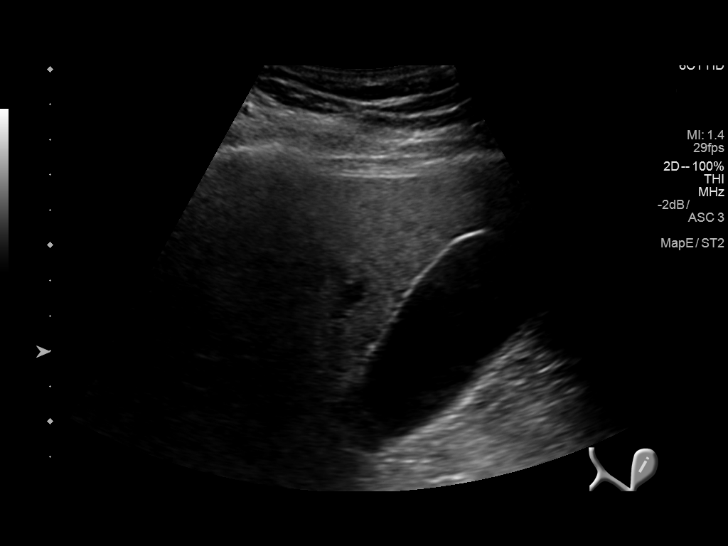
[im 8/85]
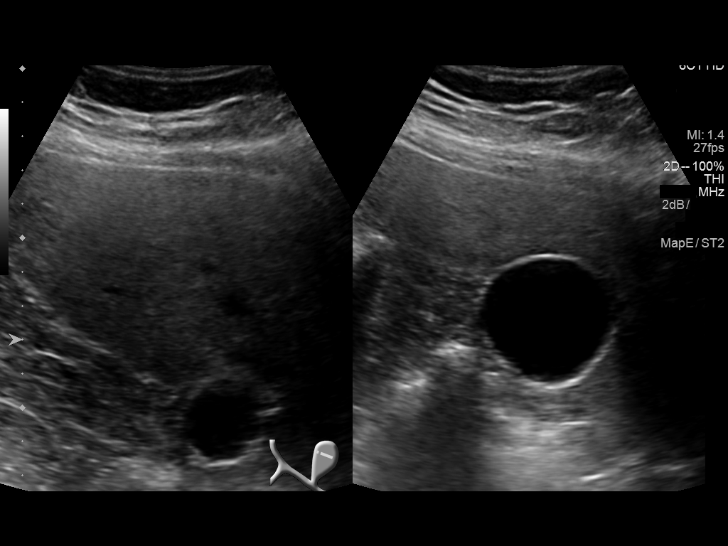
[im 15/85]
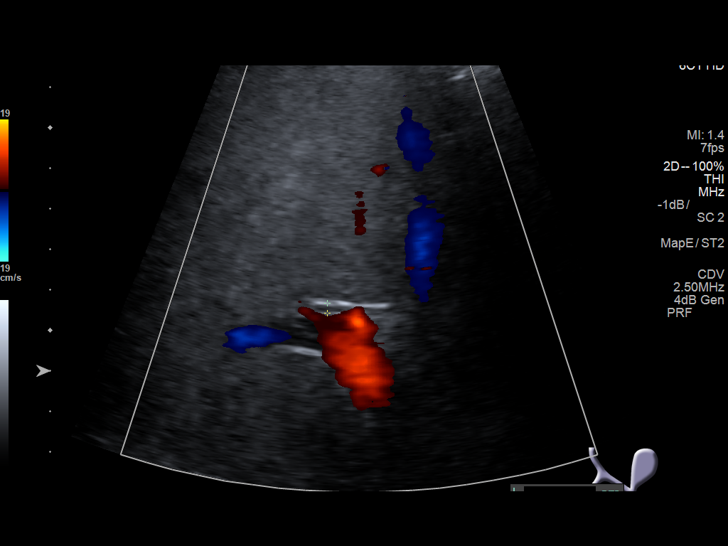
[im 22/85]
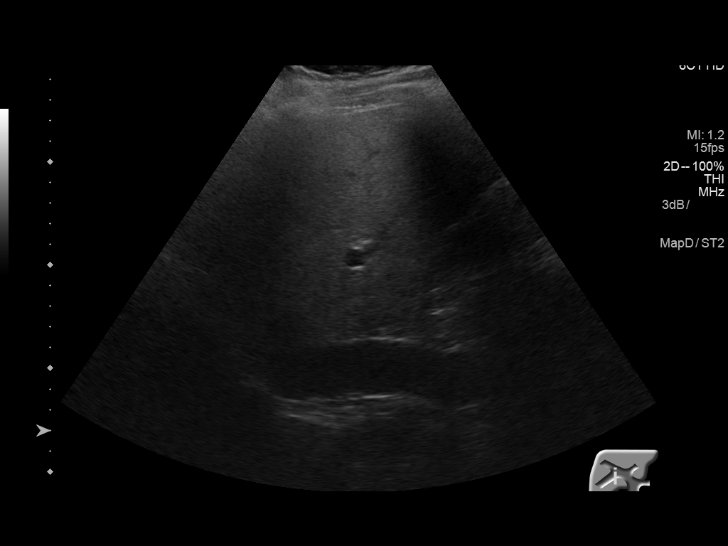
[im 29/85]
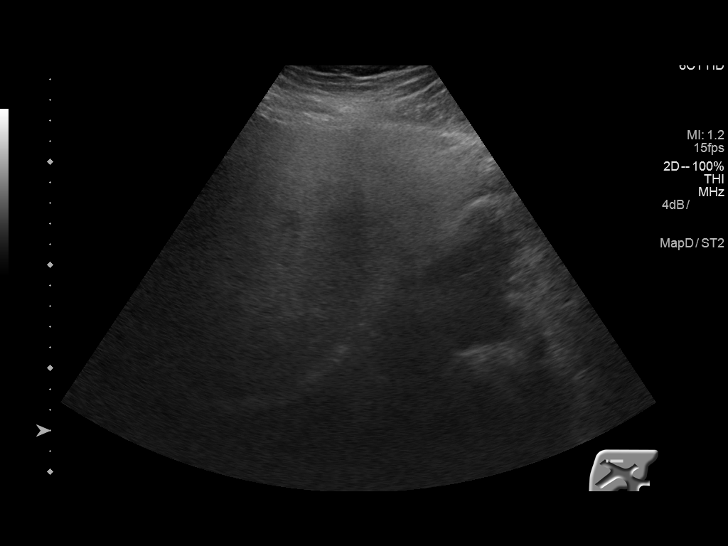
[im 32/85]
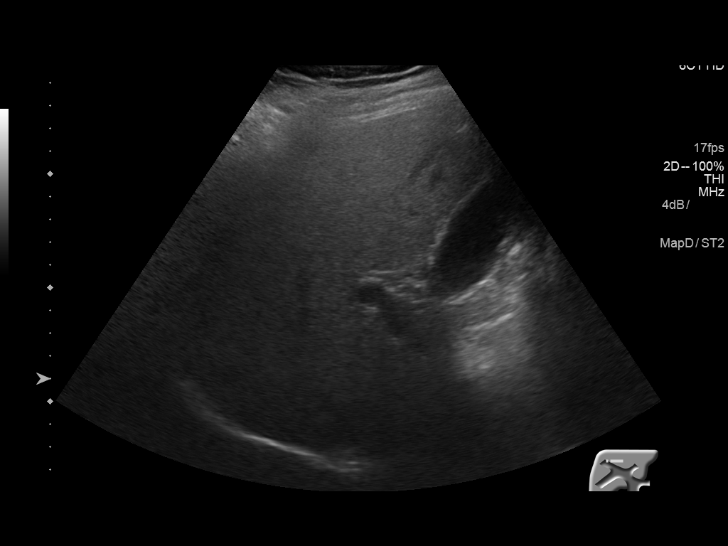
[im 39/85]
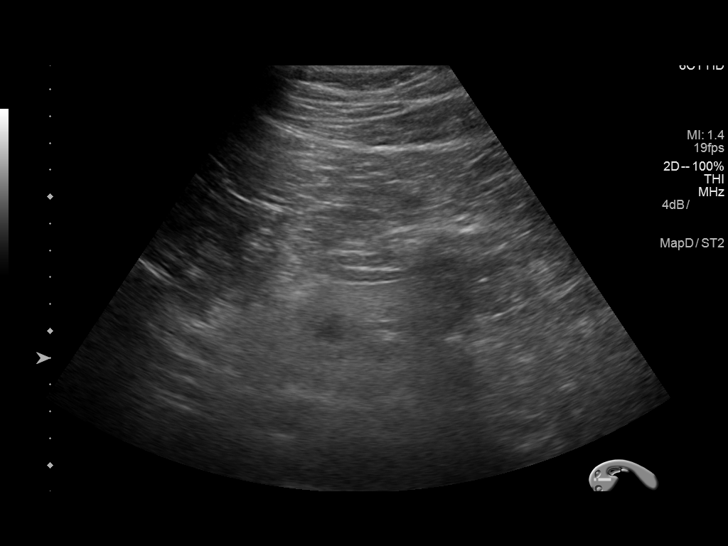
[im 46/85]
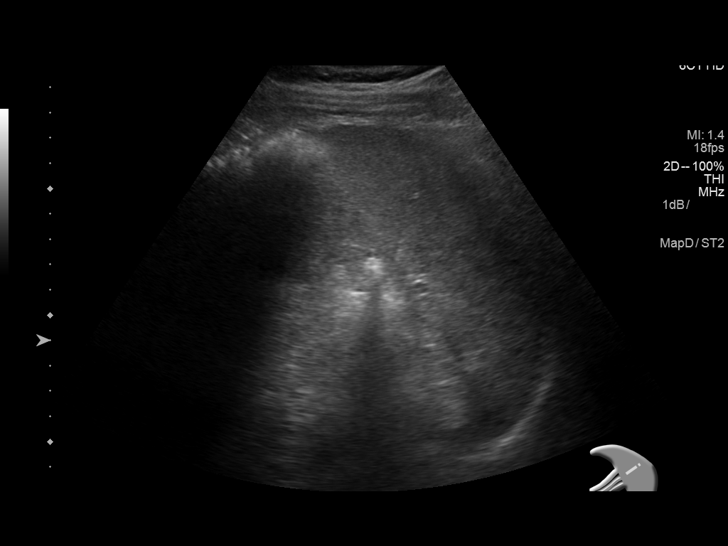
[im 53/85]
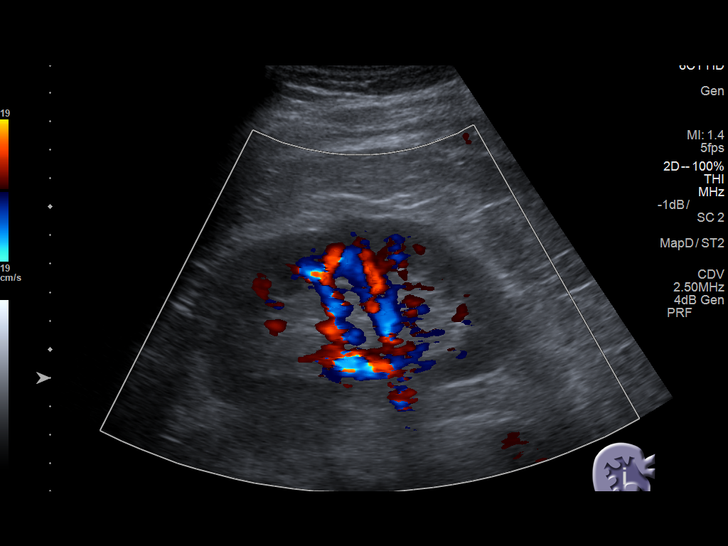
[im 57/85]
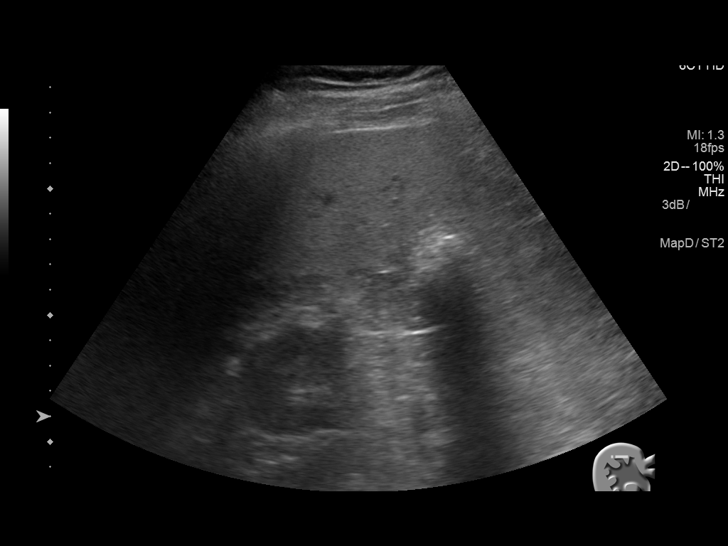
[im 64/85]
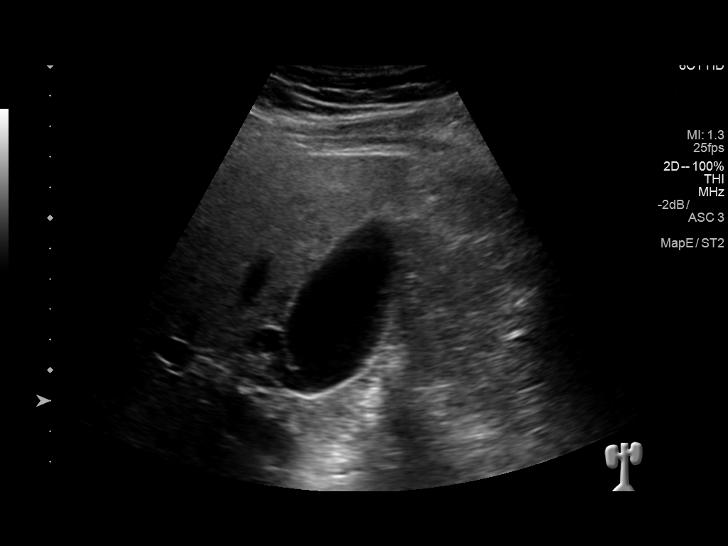
[im 71/85]
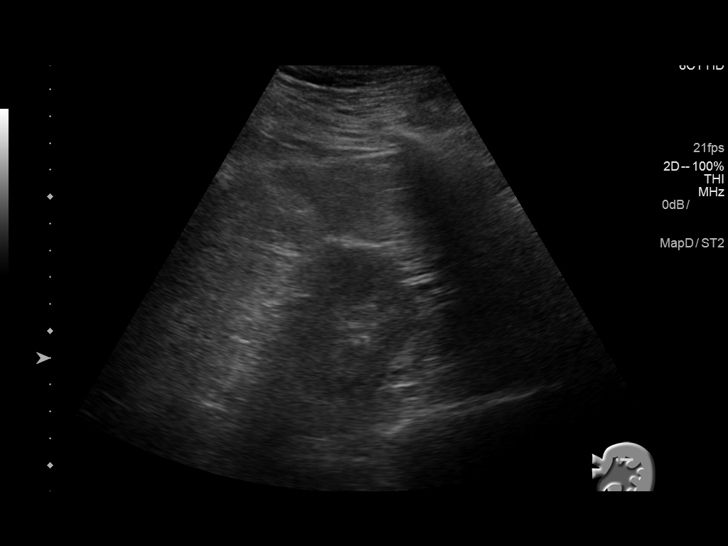
[im 78/85]
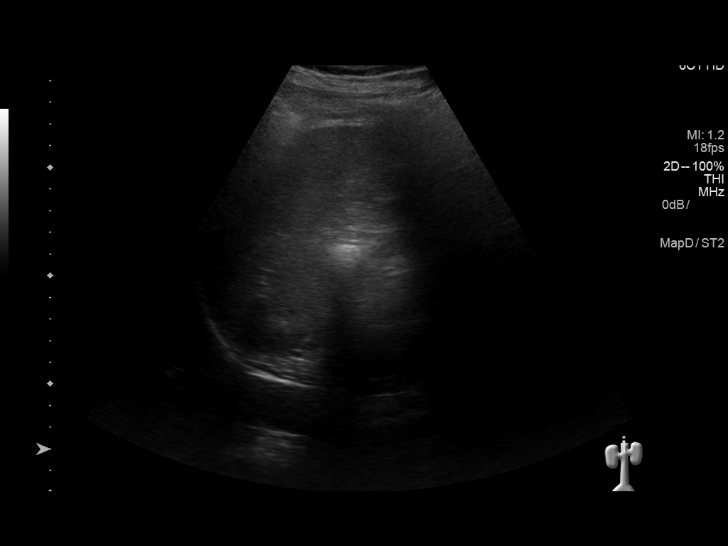
[im 85/85]
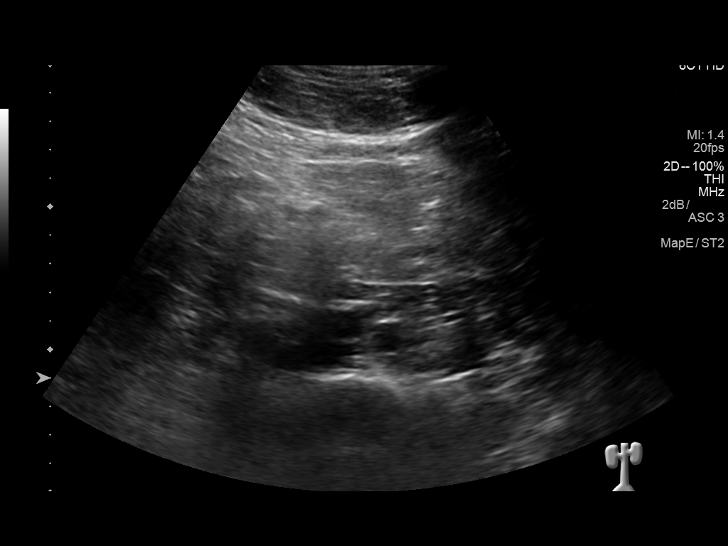

[14 of 25 positions shown; findings below may reference images not displayed]

FINDINGS: Gallbladder: No gallstones or wall thickening visualized. No
sonographic Murphy sign noted by sonographer.

Common bile duct: Diameter: 3 mm, normal

Liver: Mildly to moderately increased hepatic echogenicity (image
56). Coarse echogenicity. No discrete liver lesion. No intrahepatic
biliary ductal dilatation.

IVC: Incompletely visualized due to overlying bowel gas, visualized
portions within normal limits.

Pancreas: Incompletely visualized due to overlying bowel gas,
visualized portions within normal limits.

Spleen: Size and appearance within normal limits.

Right Kidney: Length: 12.7 cm. Echogenicity within normal limits. No
mass or hydronephrosis visualized.

Left Kidney: Length: 13.5 cm. Echogenicity within normal limits. No
mass or hydronephrosis visualized.

Abdominal aorta: No aneurysm visualized.

Other findings: None.
IMPRESSION: 1. Fatty liver disease.
2. Otherwise negative abdomen ultrasound.

## 2018-02-23 ENCOUNTER — Encounter: Payer: Self-pay | Admitting: Internal Medicine

## 2018-02-23 ENCOUNTER — Ambulatory Visit (INDEPENDENT_AMBULATORY_CARE_PROVIDER_SITE_OTHER): Payer: Managed Care, Other (non HMO) | Admitting: Internal Medicine

## 2018-02-23 VITALS — BP 138/92 | HR 68 | Temp 97.4°F | Resp 16 | Wt 289.0 lb

## 2018-02-23 DIAGNOSIS — R945 Abnormal results of liver function studies: Secondary | ICD-10-CM

## 2018-02-23 DIAGNOSIS — R739 Hyperglycemia, unspecified: Secondary | ICD-10-CM | POA: Diagnosis not present

## 2018-02-23 DIAGNOSIS — Z6841 Body Mass Index (BMI) 40.0 and over, adult: Secondary | ICD-10-CM

## 2018-02-23 DIAGNOSIS — D649 Anemia, unspecified: Secondary | ICD-10-CM

## 2018-02-23 DIAGNOSIS — I10 Essential (primary) hypertension: Secondary | ICD-10-CM

## 2018-02-23 LAB — BASIC METABOLIC PANEL
BUN: 13 mg/dL (ref 6–23)
CO2: 28 meq/L (ref 19–32)
Calcium: 9.4 mg/dL (ref 8.4–10.5)
Chloride: 103 mEq/L (ref 96–112)
Creatinine, Ser: 0.99 mg/dL (ref 0.40–1.50)
GFR: 83.72 mL/min (ref >60.00)
Glucose, Bld: 115 mg/dL — ABNORMAL HIGH (ref 70–99)
Potassium: 4.3 mEq/L (ref 3.5–5.1)
Sodium: 138 mEq/L (ref 135–145)

## 2018-02-23 LAB — HEPATIC FUNCTION PANEL
ALT: 50 U/L (ref 0–53)
AST: 35 U/L (ref 0–37)
Albumin: 4.6 g/dL (ref 3.5–5.2)
Alkaline Phosphatase: 41 U/L (ref 39–117)
Bilirubin, Direct: 0.1 mg/dL (ref 0.0–0.3)
Total Bilirubin: 0.6 mg/dL (ref 0.2–1.2)
Total Protein: 7.2 g/dL (ref 6.0–8.3)

## 2018-02-23 LAB — HEMOGLOBIN A1C: Hgb A1c MFr Bld: 5.8 % (ref 4.6–6.5)

## 2018-02-23 LAB — LIPID PANEL
Cholesterol: 130 mg/dL (ref 0–200)
HDL: 44 mg/dL (ref >39.00)
LDL Cholesterol: 59 mg/dL (ref 0–99)
NonHDL: 85.91
Total CHOL/HDL Ratio: 3
Triglycerides: 133 mg/dL (ref 0.0–149.0)
VLDL: 26.6 mg/dL (ref 0.0–40.0)

## 2018-02-23 MED ORDER — LISINOPRIL 10 MG PO TABS: 10.0000 mg | ORAL_TABLET | Freq: Every day | ORAL | 3 refills | Status: DC

## 2018-02-23 MED ORDER — METOPROLOL TARTRATE 25 MG PO TABS: ORAL_TABLET | ORAL | 1 refills | Status: DC

## 2018-02-23 NOTE — Progress Notes (Signed)
[name], male   DOB: May 26, 1965, 52 y.o.   MRN: [id number]   Subjective:    [name], male    DOB: [birth year], 52 y.o.   MRN: [id number]  HPI  Patient here for a scheduled follow up.  States he is doing relatively well.  Increased stress with work.  Overall feels he is handling things relatively well.  Does not feel he needs any further intervention. No chest pain.  No sob.  No acid reflux.  No abdominal pain.  Bowels moving.  Discussed diet and exercise.  He has not taken his lisinopril this week.  Blood pressure a little elevated.  He states has been under good control.     Past Medical History:  Diagnosis Date  . Environmental allergies   . Hypertension   . Nephrolithiasis    Past Surgical History:  Procedure Laterality Date  . COLONOSCOPY WITH PROPOFOL N/A 06/16/2015   Procedure: COLONOSCOPY WITH PROPOFOL;  Surgeon: Manya Silvas, MD;  Location: [facility] ENDOSCOPY;  Service: Endoscopy;  Laterality: N/A;   Family History  Problem Relation Age of Onset  . Hypertension Father   . Hypertension Mother   . Diabetes Mother   . Down syndrome Sister   . Prostate cancer Neg Hx   . Colon cancer Neg Hx    Social History   Socioeconomic History  . Marital status: Married    Spouse name: Not on file  . Number of children: 2  . Years of education: Not on file  . Highest education level: Not on file  Occupational History  . Not on file  Social Needs  . Financial resource strain: Not on file  . Food insecurity:    Worry: Not on file    Inability: Not on file  . Transportation needs:    Medical: Not on file    Non-medical: Not on file  Tobacco Use  . Smoking status: Never Smoker  . Smokeless tobacco: Never Used  Substance and Sexual Activity  . Alcohol use: No    Alcohol/week: 0.0 standard drinks  . Drug use: No  . Sexual activity: Not on file  Lifestyle  . Physical activity:    Days per week: Not on file    Minutes per session: Not  on file  . Stress: Not on file  Relationships  . Social connections:    Talks on phone: Not on file    Gets together: Not on file    Attends religious service: Not on file    Active member of club or organization: Not on file    Attends meetings of clubs or organizations: Not on file    Relationship status: Not on file  Other Topics Concern  . Not on file  Social History Narrative  . Not on file    Outpatient Encounter Medications as of 02/23/2018  Medication Sig  . fish oil-omega-3 fatty acids 1000 MG capsule Take 1 g by mouth daily.  . fluticasone (FLONASE) 50 MCG/ACT nasal spray Place 2 sprays into both nostrils daily.  Marland Kitchen lisinopril (PRINIVIL,ZESTRIL) 10 MG tablet Take 1 tablet (10 mg total) by mouth daily.  . metoprolol tartrate (LOPRESSOR) 25 MG tablet TAKE 1 TABLET BY MOUTH TWICE DAILY.  . Multiple Vitamin (MULTIVITAMIN) tablet Take 1 tablet by mouth daily.  Marland Kitchen triamcinolone cream (KENALOG) 0.1 % Apply 1 application topically 2 (two) times daily.  . [DISCONTINUED] lisinopril (PRINIVIL,ZESTRIL) 10 MG tablet Take 1 tablet (10 mg total) by  mouth daily.  . [DISCONTINUED] metoprolol tartrate (LOPRESSOR) 25 MG tablet TAKE 1 TABLET BY MOUTH TWICE DAILY.   No facility-administered encounter medications on file as of 02/23/2018.     Review of Systems  Constitutional: Negative for appetite change and unexpected weight change.  HENT: Negative for congestion and sinus pressure.   Respiratory: Negative for cough, chest tightness and shortness of breath.   Cardiovascular: Negative for chest pain, palpitations and leg swelling.  Gastrointestinal: Negative for abdominal pain, diarrhea, nausea and vomiting.  Genitourinary: Negative for difficulty urinating and dysuria.  Musculoskeletal: Negative for joint swelling and myalgias.  Skin: Negative for color change and rash.  Neurological: Negative for dizziness, light-headedness and headaches.  Psychiatric/Behavioral: Negative for agitation and  dysphoric mood.       Objective:    Physical Exam  Constitutional: He appears well-developed and well-nourished. No distress.  HENT:  Nose: Nose normal.  Mouth/Throat: Oropharynx is clear and moist.  Neck: Neck supple. No thyromegaly present.  Cardiovascular: Normal rate and regular rhythm.  Pulmonary/Chest: Effort normal and breath sounds normal. No respiratory distress.  Abdominal: Soft. Bowel sounds are normal. There is no tenderness.  Musculoskeletal: He exhibits no edema or tenderness.  Lymphadenopathy:    He has no cervical adenopathy.  Skin: No rash noted. No erythema.  Psychiatric: He has a normal mood and affect. His behavior is normal.    BP (!) 138/92 (BP Location: Left Arm, Patient Position: Sitting, Cuff Size: Normal)   Pulse 68   Temp (!) 97.4 F (36.3 C) (Oral)   Resp 16   Wt 289 lb (131.1 kg)   SpO2 97%   BMI 40.31 kg/m  Wt Readings from Last 3 Encounters:  02/23/18 289 lb (131.1 kg)  08/19/17 291 lb 12.8 oz (132.4 kg)  03/24/16 282 lb 9.6 oz (128.2 kg)     Lab Results  Component Value Date   WBC 5.8 08/31/2017   HGB 15.1 08/31/2017   HCT 43.4 08/31/2017   PLT 228.0 08/31/2017   GLUCOSE 115 (H) 02/23/2018   CHOL 130 02/23/2018   TRIG 133.0 02/23/2018   HDL 44.00 02/23/2018   LDLCALC 59 02/23/2018   ALT 50 02/23/2018   AST 35 02/23/2018   NA 138 02/23/2018   K 4.3 02/23/2018   CL 103 02/23/2018   CREATININE 0.99 02/23/2018   BUN 13 02/23/2018   CO2 28 02/23/2018   TSH 1.18 08/31/2017   PSA 0.20 08/31/2017   HGBA1C 5.8 02/23/2018       Assessment & Plan:   Problem List Items Addressed This Visit    Abnormal liver function - Primary    Previous ultrasound revealed fatty liver.  He had wanted to hold on hepatitis vaccines.  Discussed diet and exercise.  Follow liver function tests.        Relevant Orders   Hepatic function panel (Completed)   Anemia    Follow cbc.       BMI 40.0-44.9, adult (HCC)    Discussed diet and exercise.         Hyperglycemia    Low carb diet and exercise.  Follow met b and a1c.        Relevant Orders   Hemoglobin A1c (Completed)   Hypertension    Blood pressure elevated today.  Has been off lisinopril for the past week.  Restart lisinopril.  Send in blood pressure readings.  Hold on additional medication.  States he will send in readings and then if need to adjust  medication - agreeable.  Follow metabolic panel.       Relevant Medications   lisinopril (PRINIVIL,ZESTRIL) 10 MG tablet   metoprolol tartrate (LOPRESSOR) 25 MG tablet   Other Relevant Orders   Lipid panel (Completed)   Basic metabolic panel (Completed)       Einar Pheasant, MD

## 2018-02-25 ENCOUNTER — Encounter: Payer: Self-pay | Admitting: Internal Medicine

## 2018-02-25 NOTE — Assessment & Plan Note (Signed)
Follow cbc.  

## 2018-02-25 NOTE — Assessment & Plan Note (Signed)
Low carb diet and exercise.  Follow met b and a1c.   

## 2018-02-25 NOTE — Assessment & Plan Note (Signed)
Previous ultrasound revealed fatty liver.  He had wanted to hold on hepatitis vaccines.  Discussed diet and exercise.  Follow liver function tests.

## 2018-02-25 NOTE — Assessment & Plan Note (Signed)
Discussed diet and exercise 

## 2018-02-25 NOTE — Assessment & Plan Note (Signed)
Blood pressure elevated today.  Has been off lisinopril for the past week.  Restart lisinopril.  Send in blood pressure readings.  Hold on additional medication.  States he will send in readings and then if need to adjust medication - agreeable.  Follow metabolic panel.

## 2018-10-02 ENCOUNTER — Other Ambulatory Visit: Payer: Self-pay | Admitting: Internal Medicine

## 2018-11-03 ENCOUNTER — Encounter: Payer: Self-pay | Admitting: Internal Medicine

## 2018-12-06 ENCOUNTER — Ambulatory Visit (INDEPENDENT_AMBULATORY_CARE_PROVIDER_SITE_OTHER): Payer: 59 | Admitting: Internal Medicine

## 2018-12-06 ENCOUNTER — Encounter: Payer: Self-pay | Admitting: Internal Medicine

## 2018-12-06 ENCOUNTER — Other Ambulatory Visit: Payer: Self-pay

## 2018-12-06 VITALS — BP 138/88 | HR 72 | Temp 97.4°F | Resp 16 | Wt 284.0 lb

## 2018-12-06 DIAGNOSIS — Z0000 Encounter for general adult medical examination without abnormal findings: Secondary | ICD-10-CM | POA: Diagnosis not present

## 2018-12-06 DIAGNOSIS — Z9109 Other allergy status, other than to drugs and biological substances: Secondary | ICD-10-CM

## 2018-12-06 DIAGNOSIS — Z23 Encounter for immunization: Secondary | ICD-10-CM | POA: Diagnosis not present

## 2018-12-06 DIAGNOSIS — I10 Essential (primary) hypertension: Secondary | ICD-10-CM

## 2018-12-06 DIAGNOSIS — Z13220 Encounter for screening for lipoid disorders: Secondary | ICD-10-CM | POA: Diagnosis not present

## 2018-12-06 DIAGNOSIS — D72819 Decreased white blood cell count, unspecified: Secondary | ICD-10-CM

## 2018-12-06 DIAGNOSIS — R945 Abnormal results of liver function studies: Secondary | ICD-10-CM

## 2018-12-06 DIAGNOSIS — R739 Hyperglycemia, unspecified: Secondary | ICD-10-CM | POA: Diagnosis not present

## 2018-12-06 LAB — LIPID PANEL
Cholesterol: 127 mg/dL (ref 0–200)
HDL: 46.9 mg/dL (ref >39.00)
LDL Cholesterol: 63 mg/dL (ref 0–99)
NonHDL: 80.49
Total CHOL/HDL Ratio: 3
Triglycerides: 88 mg/dL (ref 0.0–149.0)
VLDL: 17.6 mg/dL (ref 0.0–40.0)

## 2018-12-06 LAB — CBC WITH DIFFERENTIAL/PLATELET
Basophils Absolute: 0 10*3/uL (ref 0.0–0.1)
Basophils Relative: 0.4 % (ref 0.0–3.0)
Eosinophils Absolute: 0.1 10*3/uL (ref 0.0–0.7)
Eosinophils Relative: 1.8 % (ref 0.0–5.0)
HCT: 45.1 % (ref 39.0–52.0)
Hemoglobin: 15.2 g/dL (ref 13.0–17.0)
Lymphocytes Relative: 28.4 % (ref 12.0–46.0)
Lymphs Abs: 1.7 10*3/uL (ref 0.7–4.0)
MCHC: 33.8 g/dL (ref 30.0–36.0)
MCV: 86.9 fl (ref 78.0–100.0)
Monocytes Absolute: 0.6 10*3/uL (ref 0.1–1.0)
Monocytes Relative: 10.6 % (ref 3.0–12.0)
Neutro Abs: 3.5 10*3/uL (ref 1.4–7.7)
Neutrophils Relative %: 58.8 % (ref 43.0–77.0)
Platelets: 247 10*3/uL (ref 150.0–400.0)
RBC: 5.19 Mil/uL (ref 4.22–5.81)
RDW: 13.9 % (ref 11.5–15.5)
WBC: 6 10*3/uL (ref 4.0–10.5)

## 2018-12-06 LAB — HEPATIC FUNCTION PANEL
ALT: 31 U/L (ref 0–53)
AST: 22 U/L (ref 0–37)
Albumin: 4.6 g/dL (ref 3.5–5.2)
Alkaline Phosphatase: 44 U/L (ref 39–117)
Bilirubin, Direct: 0.1 mg/dL (ref 0.0–0.3)
Total Bilirubin: 0.6 mg/dL (ref 0.2–1.2)
Total Protein: 7.6 g/dL (ref 6.0–8.3)

## 2018-12-06 LAB — BASIC METABOLIC PANEL
BUN: 13 mg/dL (ref 6–23)
CO2: 27 mEq/L (ref 19–32)
Calcium: 9.7 mg/dL (ref 8.4–10.5)
Chloride: 104 mEq/L (ref 96–112)
Creatinine, Ser: 0.98 mg/dL (ref 0.40–1.50)
GFR: 79.47 mL/min (ref >60.00)
Glucose, Bld: 87 mg/dL (ref 70–99)
Potassium: 4.1 mEq/L (ref 3.5–5.1)
Sodium: 140 mEq/L (ref 135–145)

## 2018-12-06 LAB — HEMOGLOBIN A1C: Hgb A1c MFr Bld: 5.9 % (ref 4.6–6.5)

## 2018-12-06 LAB — TSH: TSH: 0.81 u[IU]/mL (ref 0.35–4.50)

## 2018-12-06 MED ORDER — LISINOPRIL 20 MG PO TABS: 20.0000 mg | ORAL_TABLET | Freq: Every day | ORAL | 1 refills | Status: DC

## 2018-12-06 NOTE — Assessment & Plan Note (Addendum)
Physical today 12/06/18.  Check psa.  colonoscopy 05/2015.  Recommended f/u 05/2020.

## 2018-12-06 NOTE — Progress Notes (Signed)
[name], male   DOB: December 01, 1965, 53 y.o.   MRN: [id number]   Subjective:    [name], male    DOB: [birth year], 53 y.o.   MRN: [id number]  HPI  Patient here for his physical exam.  Increased stress with work. Discussed with him today.  Overall he feels he is handling things relatively well.  States his wife was questioning his sleep.  He does snore.  Discussed sleep apnea.  Feels rested in am. Discussed pulmonary evaluation for possible sleep apnea.  He declines.  Discussed importance of treating if present.  Will let me know if he changes his mind.  Tries to stay active.  No chest pain.  No sob.  No acid reflux.  No abdominal pain.  Bowels moving.  States his blood pressure has been doing well.     Past Medical History:  Diagnosis Date  . Environmental allergies   . Hypertension   . Nephrolithiasis    Past Surgical History:  Procedure Laterality Date  . COLONOSCOPY WITH PROPOFOL N/A 06/16/2015   Procedure: COLONOSCOPY WITH PROPOFOL;  Surgeon: Manya Silvas, MD;  Location: [facility] ENDOSCOPY;  Service: Endoscopy;  Laterality: N/A;   Family History  Problem Relation Age of Onset  . Hypertension Father   . Hypertension Mother   . Diabetes Mother   . Down syndrome Sister   . Prostate cancer Neg Hx   . Colon cancer Neg Hx    Social History   Socioeconomic History  . Marital status: Married    Spouse name: Not on file  . Number of children: 2  . Years of education: Not on file  . Highest education level: Not on file  Occupational History  . Not on file  Social Needs  . Financial resource strain: Not on file  . Food insecurity    Worry: Not on file    Inability: Not on file  . Transportation needs    Medical: Not on file    Non-medical: Not on file  Tobacco Use  . Smoking status: Never Smoker  . Smokeless tobacco: Never Used  Substance and Sexual Activity  . Alcohol use: No    Alcohol/week: 0.0 standard drinks  . Drug use: No  .  Sexual activity: Not on file  Lifestyle  . Physical activity    Days per week: Not on file    Minutes per session: Not on file  . Stress: Not on file  Relationships  . Social Herbalist on phone: Not on file    Gets together: Not on file    Attends religious service: Not on file    Active member of club or organization: Not on file    Attends meetings of clubs or organizations: Not on file    Relationship status: Not on file  Other Topics Concern  . Not on file  Social History Narrative  . Not on file    Outpatient Encounter Medications as of 12/06/2018  Medication Sig  . fish oil-omega-3 fatty acids 1000 MG capsule Take 1 g by mouth daily.  . fluticasone (FLONASE) 50 MCG/ACT nasal spray Place 2 sprays into both nostrils daily.  . metoprolol tartrate (LOPRESSOR) 25 MG tablet Take 1 tablet by mouth twice daily  . Multiple Vitamin (MULTIVITAMIN) tablet Take 1 tablet by mouth daily.  Marland Kitchen triamcinolone cream (KENALOG) 0.1 % Apply 1 application topically 2 (two) times daily.  . Turmeric 500 MG TABS Take by  mouth.  . [DISCONTINUED] lisinopril (PRINIVIL,ZESTRIL) 10 MG tablet Take 1 tablet (10 mg total) by mouth daily.  Marland Kitchen lisinopril (ZESTRIL) 20 MG tablet Take 1 tablet (20 mg total) by mouth daily.   No facility-administered encounter medications on file as of 12/06/2018.     Review of Systems  Constitutional: Negative for appetite change and unexpected weight change.  HENT: Negative for congestion and sinus pressure.   Eyes: Negative for pain and visual disturbance.  Respiratory: Negative for cough, chest tightness and shortness of breath.   Cardiovascular: Negative for chest pain, palpitations and leg swelling.  Gastrointestinal: Negative for abdominal pain, diarrhea, nausea and vomiting.  Genitourinary: Negative for difficulty urinating and dysuria.  Musculoskeletal: Negative for joint swelling and myalgias.  Skin: Negative for color change and rash.  Neurological:  Negative for dizziness, light-headedness and headaches.  Hematological: Negative for adenopathy. Does not bruise/bleed easily.  Psychiatric/Behavioral: Negative for agitation and dysphoric mood.       Objective:    Physical Exam Constitutional:      General: He is not in acute distress.    Appearance: Normal appearance. He is well-developed.  HENT:     Head: Normocephalic and atraumatic.     Right Ear: External ear normal.     Left Ear: External ear normal.     Mouth/Throat:     Pharynx: No oropharyngeal exudate.  Eyes:     General: No scleral icterus.       Right eye: No discharge.        Left eye: No discharge.     Conjunctiva/sclera: Conjunctivae normal.  Neck:     Musculoskeletal: Neck supple. No muscular tenderness.     Thyroid: No thyromegaly.  Cardiovascular:     Rate and Rhythm: Normal rate and regular rhythm.  Pulmonary:     Effort: No respiratory distress.     Breath sounds: Normal breath sounds. No wheezing.  Abdominal:     General: Bowel sounds are normal.     Palpations: Abdomen is soft.     Tenderness: There is no abdominal tenderness.  Genitourinary:    Comments: Not performed.   Musculoskeletal:        General: No tenderness.  Lymphadenopathy:     Cervical: No cervical adenopathy.  Skin:    Findings: No erythema or rash.  Neurological:     Mental Status: He is alert and oriented to person, place, and time.  Psychiatric:        Mood and Affect: Mood normal.        Behavior: Behavior normal.     BP 138/88   Pulse 72   Temp (!) 97.4 F (36.3 C)   Resp 16   Wt 284 lb (128.8 kg)   SpO2 98%   BMI 39.61 kg/m  Wt Readings from Last 3 Encounters:  12/06/18 284 lb (128.8 kg)  02/23/18 289 lb (131.1 kg)  08/19/17 291 lb 12.8 oz (132.4 kg)     Lab Results  Component Value Date   WBC 6.0 12/06/2018   HGB 15.2 12/06/2018   HCT 45.1 12/06/2018   PLT 247.0 12/06/2018   GLUCOSE 87 12/06/2018   CHOL 127 12/06/2018   TRIG 88.0 12/06/2018   HDL  46.90 12/06/2018   LDLCALC 63 12/06/2018   ALT 31 12/06/2018   AST 22 12/06/2018   NA 140 12/06/2018   K 4.1 12/06/2018   CL 104 12/06/2018   CREATININE 0.98 12/06/2018   BUN 13 12/06/2018   CO2 27  12/06/2018   TSH 0.81 12/06/2018   PSA 0.13 12/07/2018   HGBA1C 5.9 12/06/2018    No results found.     Assessment & Plan:   Problem List Items Addressed This Visit    Abnormal liver function    Previous ultrasound revealed fatty liver.  Discussed diet and exercise. He had wanted to hold on hepatitis vaccines.  Follow liver function tests.        Relevant Orders   Hepatic function panel (Completed)   Environmental allergies    Controlled.        Healthcare maintenance    Physical today 12/06/18.  Check psa.  colonoscopy 05/2015.  Recommended f/u 05/2020.        Hyperglycemia    Low carb diet and exercise.  Follow met b and a1c.        Relevant Orders   Hemoglobin A1c (Completed)   Hypertension    Blood pressure has been under reasonable control. Slight elevation today.  Have him spot check his pressure.   Continue same medication regimen.  Follow metabolic panel.  Send in readings.        Relevant Medications   lisinopril (ZESTRIL) 20 MG tablet   Other Relevant Orders   CBC with Differential/Platelet (Completed)   TSH (Completed)   Basic metabolic panel (Completed)   Leukopenia    Follow cbc.        Other Visit Diagnoses    Routine general medical examination at a health care facility    -  Primary   Screening cholesterol level       Relevant Orders   Lipid panel (Completed)   Need for immunization against influenza       Relevant Orders   Flu Vaccine QUAD 36+ mos IM (Completed)       Einar Pheasant, MD

## 2018-12-07 ENCOUNTER — Other Ambulatory Visit (INDEPENDENT_AMBULATORY_CARE_PROVIDER_SITE_OTHER): Payer: 59

## 2018-12-07 ENCOUNTER — Telehealth: Payer: Self-pay | Admitting: *Deleted

## 2018-12-07 DIAGNOSIS — Z125 Encounter for screening for malignant neoplasm of prostate: Secondary | ICD-10-CM | POA: Diagnosis not present

## 2018-12-07 DIAGNOSIS — R739 Hyperglycemia, unspecified: Secondary | ICD-10-CM | POA: Diagnosis not present

## 2018-12-07 DIAGNOSIS — Z9109 Other allergy status, other than to drugs and biological substances: Secondary | ICD-10-CM | POA: Diagnosis not present

## 2018-12-07 DIAGNOSIS — I10 Essential (primary) hypertension: Secondary | ICD-10-CM | POA: Diagnosis not present

## 2018-12-07 DIAGNOSIS — R945 Abnormal results of liver function studies: Secondary | ICD-10-CM | POA: Diagnosis not present

## 2018-12-07 DIAGNOSIS — Z13220 Encounter for screening for lipoid disorders: Secondary | ICD-10-CM | POA: Diagnosis not present

## 2018-12-07 DIAGNOSIS — Z23 Encounter for immunization: Secondary | ICD-10-CM

## 2018-12-07 DIAGNOSIS — Z0000 Encounter for general adult medical examination without abnormal findings: Secondary | ICD-10-CM | POA: Diagnosis not present

## 2018-12-07 DIAGNOSIS — D72819 Decreased white blood cell count, unspecified: Secondary | ICD-10-CM | POA: Diagnosis not present

## 2018-12-07 LAB — PSA: PSA: 0.13 ng/mL (ref 0.10–4.00)

## 2018-12-07 NOTE — Telephone Encounter (Signed)
future order placed to add on PSA

## 2018-12-10 ENCOUNTER — Encounter: Payer: Self-pay | Admitting: Internal Medicine

## 2018-12-10 NOTE — Assessment & Plan Note (Signed)
Low carb diet and exercise.  Follow met b and a1c.   

## 2018-12-10 NOTE — Assessment & Plan Note (Signed)
Blood pressure has been under reasonable control. Slight elevation today.  Have him spot check his pressure.   Continue same medication regimen.  Follow metabolic panel.  Send in readings.

## 2018-12-10 NOTE — Assessment & Plan Note (Signed)
Follow cbc.  

## 2018-12-10 NOTE — Assessment & Plan Note (Signed)
Previous ultrasound revealed fatty liver.  Discussed diet and exercise. He had wanted to hold on hepatitis vaccines.  Follow liver function tests.

## 2018-12-10 NOTE — Assessment & Plan Note (Signed)
Controlled.  

## 2019-02-26 DIAGNOSIS — Z20828 Contact with and (suspected) exposure to other viral communicable diseases: Secondary | ICD-10-CM | POA: Diagnosis not present

## 2019-04-05 ENCOUNTER — Ambulatory Visit: Payer: 59 | Admitting: Internal Medicine

## 2019-04-23 ENCOUNTER — Other Ambulatory Visit: Payer: Self-pay | Admitting: Internal Medicine

## 2019-06-08 ENCOUNTER — Ambulatory Visit: Payer: 59 | Attending: Internal Medicine

## 2019-06-08 DIAGNOSIS — Z23 Encounter for immunization: Secondary | ICD-10-CM

## 2019-06-08 NOTE — Progress Notes (Signed)
   Covid-19 Vaccination Clinic  Name:  Derek Griffith    MRN: [id number] DOB: April 07, 1965  06/08/2019  [name] was observed post Covid-19 immunization for 15 minutes without incident. He was provided with Vaccine Information Sheet and instruction to access the V-Safe system.   [name] was instructed to call 911 with any severe reactions post vaccine: Marland Kitchen Difficulty breathing  . Swelling of face and throat  . A fast heartbeat  . A bad rash all over body  . Dizziness and weakness   Immunizations Administered    Name Date Dose VIS Date Route   Pfizer COVID-19 Vaccine 06/08/2019  8:21 AM 0.3 mL 03/02/2019 Intramuscular   Manufacturer: [place]   Lot: [id number]   [place]: [id number]

## 2019-06-11 ENCOUNTER — Encounter: Payer: Self-pay | Admitting: Internal Medicine

## 2019-06-18 NOTE — Telephone Encounter (Signed)
The Coding department will review and get back in touch with the patient.

## 2019-06-25 ENCOUNTER — Encounter: Payer: Self-pay | Admitting: Internal Medicine

## 2019-07-03 ENCOUNTER — Ambulatory Visit: Payer: 59 | Attending: Internal Medicine

## 2019-07-03 DIAGNOSIS — Z23 Encounter for immunization: Secondary | ICD-10-CM

## 2019-07-03 NOTE — Progress Notes (Signed)
   Covid-19 Vaccination Clinic  Name:  Derek Griffith    MRN: [id number] DOB: 25-Dec-1965  07/03/2019  Mr. Forget was observed post Covid-19 immunization for 15 minutes without incident. He was provided with Vaccine Information Sheet and instruction to access the V-Safe system.   [name] was instructed to call 911 with any severe reactions post vaccine: Marland Kitchen Difficulty breathing  . Swelling of face and throat  . A fast heartbeat  . A bad rash all over body  . Dizziness and weakness   Immunizations Administered    Name Date Dose VIS Date Route   Pfizer COVID-19 Vaccine 07/03/2019 12:55 PM 0.3 mL 03/02/2019 Intramuscular   Manufacturer: [place]   Lot: [id number]   [place]: [id number]

## 2019-08-27 ENCOUNTER — Other Ambulatory Visit: Payer: Self-pay | Admitting: Internal Medicine

## 2019-12-21 ENCOUNTER — Other Ambulatory Visit: Payer: Self-pay | Admitting: Internal Medicine

## 2019-12-24 MED ORDER — LISINOPRIL 20 MG PO TABS
20.0000 mg | ORAL_TABLET | Freq: Every day | ORAL | 0 refills | Status: DC
Start: 2019-12-24 — End: 2020-04-21

## 2020-04-16 ENCOUNTER — Encounter: Payer: Self-pay | Admitting: *Deleted

## 2020-04-21 ENCOUNTER — Other Ambulatory Visit: Payer: Self-pay | Admitting: Internal Medicine

## 2020-04-21 MED ORDER — LISINOPRIL 20 MG PO TABS
20.0000 mg | ORAL_TABLET | Freq: Every day | ORAL | 0 refills | Status: DC
Start: 2020-04-21 — End: 2020-07-28

## 2020-05-30 ENCOUNTER — Other Ambulatory Visit: Payer: Self-pay | Admitting: Internal Medicine

## 2020-05-30 MED ORDER — METOPROLOL TARTRATE 25 MG PO TABS
25.0000 mg | ORAL_TABLET | Freq: Two times a day (BID) | ORAL | 1 refills | Status: DC
Start: 2020-05-30 — End: 2021-03-31

## 2020-07-28 ENCOUNTER — Other Ambulatory Visit: Payer: Self-pay | Admitting: Internal Medicine

## 2020-07-28 MED ORDER — LISINOPRIL 20 MG PO TABS
20.0000 mg | ORAL_TABLET | Freq: Every day | ORAL | 0 refills | Status: DC
Start: 2020-07-28 — End: 2020-11-19

## 2020-11-18 ENCOUNTER — Other Ambulatory Visit: Payer: Self-pay | Admitting: Internal Medicine

## 2020-11-19 ENCOUNTER — Other Ambulatory Visit: Payer: Self-pay | Admitting: Internal Medicine

## 2020-11-19 MED ORDER — LISINOPRIL 20 MG PO TABS
20.0000 mg | ORAL_TABLET | Freq: Every day | ORAL | 0 refills | Status: DC
Start: 2020-11-19 — End: 2021-03-18

## 2020-12-04 ENCOUNTER — Encounter: Payer: Self-pay | Admitting: Internal Medicine

## 2020-12-17 ENCOUNTER — Encounter: Payer: Self-pay | Admitting: Internal Medicine

## 2021-03-18 ENCOUNTER — Other Ambulatory Visit: Payer: Self-pay | Admitting: Internal Medicine

## 2021-03-18 MED ORDER — LISINOPRIL 20 MG PO TABS
20.0000 mg | ORAL_TABLET | Freq: Every day | ORAL | 0 refills | Status: DC
Start: 2021-03-18 — End: 2021-03-31

## 2021-03-31 ENCOUNTER — Encounter: Payer: Self-pay | Admitting: Internal Medicine

## 2021-03-31 ENCOUNTER — Other Ambulatory Visit: Payer: Self-pay

## 2021-03-31 ENCOUNTER — Ambulatory Visit (INDEPENDENT_AMBULATORY_CARE_PROVIDER_SITE_OTHER): Payer: Managed Care, Other (non HMO) | Admitting: Internal Medicine

## 2021-03-31 VITALS — BP 126/72 | HR 71 | Temp 97.9°F | Resp 16 | Ht 71.0 in | Wt 292.0 lb

## 2021-03-31 DIAGNOSIS — Z125 Encounter for screening for malignant neoplasm of prostate: Secondary | ICD-10-CM

## 2021-03-31 DIAGNOSIS — D649 Anemia, unspecified: Secondary | ICD-10-CM

## 2021-03-31 DIAGNOSIS — R739 Hyperglycemia, unspecified: Secondary | ICD-10-CM | POA: Diagnosis not present

## 2021-03-31 DIAGNOSIS — Z0000 Encounter for general adult medical examination without abnormal findings: Secondary | ICD-10-CM | POA: Diagnosis not present

## 2021-03-31 DIAGNOSIS — D72819 Decreased white blood cell count, unspecified: Secondary | ICD-10-CM

## 2021-03-31 DIAGNOSIS — R0683 Snoring: Secondary | ICD-10-CM

## 2021-03-31 DIAGNOSIS — Z1159 Encounter for screening for other viral diseases: Secondary | ICD-10-CM

## 2021-03-31 DIAGNOSIS — I10 Essential (primary) hypertension: Secondary | ICD-10-CM

## 2021-03-31 DIAGNOSIS — R945 Abnormal results of liver function studies: Secondary | ICD-10-CM

## 2021-03-31 DIAGNOSIS — R319 Hematuria, unspecified: Secondary | ICD-10-CM

## 2021-03-31 DIAGNOSIS — Z86010 Personal history of colonic polyps: Secondary | ICD-10-CM

## 2021-03-31 DIAGNOSIS — Z13220 Encounter for screening for lipoid disorders: Secondary | ICD-10-CM

## 2021-03-31 DIAGNOSIS — Z114 Encounter for screening for human immunodeficiency virus [HIV]: Secondary | ICD-10-CM

## 2021-03-31 MED ORDER — METOPROLOL TARTRATE 25 MG PO TABS: 25.0000 mg | ORAL_TABLET | Freq: Two times a day (BID) | ORAL | 1 refills | Status: DC

## 2021-03-31 MED ORDER — LISINOPRIL 20 MG PO TABS: 20.0000 mg | ORAL_TABLET | Freq: Every day | ORAL | 1 refills | Status: DC

## 2021-03-31 NOTE — Progress Notes (Signed)
[name], male   DOB: 1965-06-15, 56 y.o.   MRN: [id number]   Subjective:    [name], male    DOB: 11/09/1965, 56 y.o.   MRN: [id number]  This visit occurred during the SARS-CoV-2 public health emergency.  Safety protocols were in place, including screening questions prior to the visit, additional usage of staff PPE, and extensive cleaning of exam room while observing appropriate contact time as indicated for disinfecting solutions.   Patient here for his physical exam.   Chief Complaint  Patient presents with   Annual Exam   .   HPI Have not seen him since 11/2018.  States he is doing relatively well.  Was seen at acute care 11/2020 - back spasm and blood in urine.  Treated with zofran, flomax and tramadol.  No symptoms now.  No chest pain or sob reported.  No abdominal pain or bowel change reported.  No urine symptoms reported.  Discussed overdue f/u colonoscopy.  Also concern regarding sleep apnea.  Snoring.  History of high blood pressure.  Agreeable to referral to pulmonary.     Past Medical History:  Diagnosis Date   Environmental allergies    Hypertension    Nephrolithiasis    Past Surgical History:  Procedure Laterality Date   COLONOSCOPY WITH PROPOFOL N/A 06/16/2015   Procedure: COLONOSCOPY WITH PROPOFOL;  Surgeon: Manya Silvas, MD;  Location: [facility] ENDOSCOPY;  Service: Endoscopy;  Laterality: N/A;   Family History  Problem Relation Age of Onset   Hypertension Father    Hypertension Mother    Diabetes Mother    Down syndrome Sister    Prostate cancer Neg Hx    Colon cancer Neg Hx    Social History   Socioeconomic History   Marital status: Married    Spouse name: Not on file   Number of children: 2   Years of education: Not on file   Highest education level: Not on file  Occupational History   Not on file  Tobacco Use   Smoking status: Never   Smokeless tobacco: Never  Substance and Sexual Activity   Alcohol use: No     Alcohol/week: 0.0 standard drinks   Drug use: No   Sexual activity: Not on file  Other Topics Concern   Not on file  Social History Narrative   Not on file   Social Determinants of Health   Financial Resource Strain: Not on file  Food Insecurity: Not on file  Transportation Needs: Not on file  Physical Activity: Not on file  Stress: Not on file  Social Connections: Not on file     Review of Systems  Constitutional:  Negative for appetite change and unexpected weight change.  HENT:  Negative for congestion, sinus pressure and sore throat.   Eyes:  Negative for pain and visual disturbance.  Respiratory:  Negative for cough, chest tightness and shortness of breath.   Cardiovascular:  Negative for chest pain, palpitations and leg swelling.  Gastrointestinal:  Negative for abdominal pain, diarrhea, nausea and vomiting.  Genitourinary:  Negative for difficulty urinating and dysuria.  Musculoskeletal:  Negative for joint swelling and myalgias.  Skin:  Negative for color change and rash.  Neurological:  Negative for dizziness, light-headedness and headaches.  Hematological:  Negative for adenopathy. Does not bruise/bleed easily.  Psychiatric/Behavioral:  Negative for agitation and dysphoric mood.       Objective:     BP 126/72    Pulse 71  Temp 97.9 F (36.6 C)    Resp 16    Ht 5' 11"  (1.803 m)    Wt 292 lb (132.5 kg)    SpO2 97%    BMI 40.73 kg/m  Wt Readings from Last 3 Encounters:  03/31/21 292 lb (132.5 kg)  12/06/18 284 lb (128.8 kg)  02/23/18 289 lb (131.1 kg)    Physical Exam Constitutional:      General: He is not in acute distress.    Appearance: Normal appearance. He is well-developed.  HENT:     Head: Normocephalic and atraumatic.     Right Ear: External ear normal.     Left Ear: External ear normal.  Eyes:     General: No scleral icterus.       Right eye: No discharge.        Left eye: No discharge.     Conjunctiva/sclera: Conjunctivae normal.   Neck:     Thyroid: No thyromegaly.  Cardiovascular:     Rate and Rhythm: Normal rate and regular rhythm.  Pulmonary:     Effort: No respiratory distress.     Breath sounds: Normal breath sounds. No wheezing.  Abdominal:     General: Bowel sounds are normal.     Palpations: Abdomen is soft.     Tenderness: There is no abdominal tenderness.  Musculoskeletal:        General: No swelling or tenderness.     Cervical back: Neck supple. No tenderness.  Lymphadenopathy:     Cervical: No cervical adenopathy.  Skin:    Findings: No erythema or rash.  Neurological:     Mental Status: He is alert and oriented to person, place, and time.  Psychiatric:        Mood and Affect: Mood normal.        Behavior: Behavior normal.     Outpatient Encounter Medications as of 03/31/2021  Medication Sig   fish oil-omega-3 fatty acids 1000 MG capsule Take 1 g by mouth daily.   fluticasone (FLONASE) 50 MCG/ACT nasal spray Place 2 sprays into both nostrils daily.   lisinopril (ZESTRIL) 20 MG tablet Take 1 tablet (20 mg total) by mouth daily.   metoprolol tartrate (LOPRESSOR) 25 MG tablet Take 1 tablet (25 mg total) by mouth 2 (two) times daily.   Multiple Vitamin (MULTIVITAMIN) tablet Take 1 tablet by mouth daily.   Turmeric 500 MG TABS Take by mouth.   [DISCONTINUED] lisinopril (ZESTRIL) 20 MG tablet Take 1 tablet (20 mg total) by mouth daily.   [DISCONTINUED] metoprolol tartrate (LOPRESSOR) 25 MG tablet Take 1 tablet (25 mg total) by mouth 2 (two) times daily.   [DISCONTINUED] triamcinolone cream (KENALOG) 0.1 % Apply 1 application topically 2 (two) times daily.   No facility-administered encounter medications on file as of 03/31/2021.     Lab Results  Component Value Date   WBC 6.0 12/06/2018   HGB 15.2 12/06/2018   HCT 45.1 12/06/2018   PLT 247.0 12/06/2018   GLUCOSE 87 12/06/2018   CHOL 127 12/06/2018   TRIG 88.0 12/06/2018   HDL 46.90 12/06/2018   LDLCALC 63 12/06/2018   ALT 31 12/06/2018    AST 22 12/06/2018   NA 140 12/06/2018   K 4.1 12/06/2018   CL 104 12/06/2018   CREATININE 0.98 12/06/2018   BUN 13 12/06/2018   CO2 27 12/06/2018   TSH 0.81 12/06/2018   PSA 0.13 12/07/2018   HGBA1C 5.9 12/06/2018       Assessment & Plan:  Problem List Items Addressed This Visit     Abnormal liver function    Previous ultrasound revealed fatty liver.  Have discussed diet and exercise.  Check liver panel.       Relevant Orders   Hepatic function panel   Anemia    Follow cbc.       Healthcare maintenance    Physical today 03/31/21.  Check psa.  Colonoscopy 3.2017. overdue f/u.  Refer to GI.  Agreeable for HIV check and hepatitis C check.        Hematuria    Previous notice of blood in urine.  Recheck urinalysis with next labs.        Relevant Orders   Urinalysis, Routine w reflex microscopic   History of colonic polyps    Colonoscopy 05/2015.  Recommended f/u colonoscopy 05/2020.       Relevant Orders   Ambulatory referral to Gastroenterology   Hyperglycemia    Low carb diet and exercise.  Follow met b and a1c.       Relevant Orders   Hemoglobin A1c   Hypertension    Blood pressure as outlined.  Continue metoprolol and lisinopril.  Follow pressures.  Follow metabolic panel.        Relevant Medications   lisinopril (ZESTRIL) 20 MG tablet   metoprolol tartrate (LOPRESSOR) 25 MG tablet   Other Relevant Orders   CBC with Differential/Platelet   Basic metabolic panel   TSH   Leukopenia    Follow cbc.       Snoring    Snoring. History of hypertension.  Concern regarding sleep apnea.  Refer to pulmonary for further evaluation.        Relevant Orders   Ambulatory referral to Pulmonology   Other Visit Diagnoses     Routine general medical examination at a health care facility    -  Primary   Screening cholesterol level       Relevant Orders   Lipid panel   Prostate cancer screening       Relevant Orders   PSA   Encounter for hepatitis C screening  test for low risk patient       Relevant Orders   Hepatitis C antibody   Screening for HIV without presence of risk factors       Relevant Orders   HIV antibody (with reflex)        Einar Pheasant, MD

## 2021-04-05 ENCOUNTER — Encounter: Payer: Self-pay | Admitting: Internal Medicine

## 2021-04-05 DIAGNOSIS — R0683 Snoring: Secondary | ICD-10-CM | POA: Insufficient documentation

## 2021-04-05 DIAGNOSIS — R319 Hematuria, unspecified: Secondary | ICD-10-CM | POA: Insufficient documentation

## 2021-04-05 NOTE — Assessment & Plan Note (Signed)
Low carb diet and exercise.  Follow met b and a1c.

## 2021-04-05 NOTE — Assessment & Plan Note (Signed)
Physical today 03/31/21.  Check psa.  Colonoscopy 3.2017. overdue f/u.  Refer to GI.  Agreeable for HIV check and hepatitis C check.

## 2021-04-05 NOTE — Assessment & Plan Note (Signed)
Follow cbc.  

## 2021-04-05 NOTE — Assessment & Plan Note (Signed)
Colonoscopy 05/2015.  Recommended f/u colonoscopy 05/2020.

## 2021-04-05 NOTE — Assessment & Plan Note (Signed)
Snoring. History of hypertension.  Concern regarding sleep apnea.  Refer to pulmonary for further evaluation.

## 2021-04-05 NOTE — Assessment & Plan Note (Signed)
Previous ultrasound revealed fatty liver.  Have discussed diet and exercise.  Check liver panel.  

## 2021-04-05 NOTE — Assessment & Plan Note (Signed)
Previous notice of blood in urine.  Recheck urinalysis with next labs.

## 2021-04-05 NOTE — Assessment & Plan Note (Signed)
Blood pressure as outlined.  Continue metoprolol and lisinopril.  Follow pressures.  Follow metabolic panel.

## 2021-04-15 ENCOUNTER — Other Ambulatory Visit: Payer: Self-pay

## 2021-04-15 ENCOUNTER — Other Ambulatory Visit (INDEPENDENT_AMBULATORY_CARE_PROVIDER_SITE_OTHER): Payer: Managed Care, Other (non HMO)

## 2021-04-15 DIAGNOSIS — I10 Essential (primary) hypertension: Secondary | ICD-10-CM

## 2021-04-15 DIAGNOSIS — Z1159 Encounter for screening for other viral diseases: Secondary | ICD-10-CM

## 2021-04-15 DIAGNOSIS — R319 Hematuria, unspecified: Secondary | ICD-10-CM

## 2021-04-15 DIAGNOSIS — R739 Hyperglycemia, unspecified: Secondary | ICD-10-CM | POA: Diagnosis not present

## 2021-04-15 DIAGNOSIS — Z13220 Encounter for screening for lipoid disorders: Secondary | ICD-10-CM | POA: Diagnosis not present

## 2021-04-15 DIAGNOSIS — Z125 Encounter for screening for malignant neoplasm of prostate: Secondary | ICD-10-CM

## 2021-04-15 DIAGNOSIS — R945 Abnormal results of liver function studies: Secondary | ICD-10-CM

## 2021-04-15 DIAGNOSIS — Z114 Encounter for screening for human immunodeficiency virus [HIV]: Secondary | ICD-10-CM

## 2021-04-15 LAB — HEPATIC FUNCTION PANEL
ALT: 68 U/L — ABNORMAL HIGH (ref 0–53)
AST: 45 U/L — ABNORMAL HIGH (ref 0–37)
Albumin: 4.6 g/dL (ref 3.5–5.2)
Alkaline Phosphatase: 47 U/L (ref 39–117)
Bilirubin, Direct: 0.2 mg/dL (ref 0.0–0.3)
Total Bilirubin: 0.7 mg/dL (ref 0.2–1.2)
Total Protein: 7.5 g/dL (ref 6.0–8.3)

## 2021-04-15 LAB — LIPID PANEL
Cholesterol: 146 mg/dL (ref 0–200)
HDL: 43.7 mg/dL (ref >39.00)
LDL Cholesterol: 69 mg/dL (ref 0–99)
NonHDL: 102.01
Total CHOL/HDL Ratio: 3
Triglycerides: 167 mg/dL — ABNORMAL HIGH (ref 0.0–149.0)
VLDL: 33.4 mg/dL (ref 0.0–40.0)

## 2021-04-15 LAB — CBC WITH DIFFERENTIAL/PLATELET
Basophils Absolute: 0 10*3/uL (ref 0.0–0.1)
Basophils Relative: 0.5 % (ref 0.0–3.0)
Eosinophils Absolute: 0.1 10*3/uL (ref 0.0–0.7)
Eosinophils Relative: 1.9 % (ref 0.0–5.0)
HCT: 45.6 % (ref 39.0–52.0)
Hemoglobin: 14.9 g/dL (ref 13.0–17.0)
Lymphocytes Relative: 29.5 % (ref 12.0–46.0)
Lymphs Abs: 1.5 10*3/uL (ref 0.7–4.0)
MCHC: 32.6 g/dL (ref 30.0–36.0)
MCV: 87.5 fl (ref 78.0–100.0)
Monocytes Absolute: 0.5 10*3/uL (ref 0.1–1.0)
Monocytes Relative: 9.3 % (ref 3.0–12.0)
Neutro Abs: 3 10*3/uL (ref 1.4–7.7)
Neutrophils Relative %: 58.8 % (ref 43.0–77.0)
Platelets: 236 10*3/uL (ref 150.0–400.0)
RBC: 5.22 Mil/uL (ref 4.22–5.81)
RDW: 13.6 % (ref 11.5–15.5)
WBC: 5 10*3/uL (ref 4.0–10.5)

## 2021-04-15 LAB — PSA: PSA: 0.13 ng/mL (ref 0.10–4.00)

## 2021-04-15 LAB — BASIC METABOLIC PANEL
BUN: 12 mg/dL (ref 6–23)
CO2: 27 mEq/L (ref 19–32)
Calcium: 9.7 mg/dL (ref 8.4–10.5)
Chloride: 103 mEq/L (ref 96–112)
Creatinine, Ser: 0.99 mg/dL (ref 0.40–1.50)
GFR: 84.77 mL/min (ref >60.00)
Glucose, Bld: 117 mg/dL — ABNORMAL HIGH (ref 70–99)
Potassium: 4.4 mEq/L (ref 3.5–5.1)
Sodium: 138 mEq/L (ref 135–145)

## 2021-04-15 LAB — TSH: TSH: 1.04 u[IU]/mL (ref 0.35–5.50)

## 2021-04-15 LAB — HEMOGLOBIN A1C: Hgb A1c MFr Bld: 6.1 % (ref 4.6–6.5)

## 2021-04-16 ENCOUNTER — Telehealth: Payer: Self-pay

## 2021-04-16 LAB — HIV ANTIBODY (ROUTINE TESTING W REFLEX): HIV 1&2 Ab, 4th Generation: NONREACTIVE

## 2021-04-16 LAB — URINALYSIS, ROUTINE W REFLEX MICROSCOPIC
Bilirubin Urine: NEGATIVE
Hgb urine dipstick: NEGATIVE
Ketones, ur: NEGATIVE
Leukocytes,Ua: NEGATIVE
Nitrite: NEGATIVE
RBC / HPF: NONE SEEN (ref 0–?)
Specific Gravity, Urine: 1.025 (ref 1.000–1.030)
Total Protein, Urine: NEGATIVE
Urine Glucose: NEGATIVE
Urobilinogen, UA: 0.2 (ref 0.0–1.0)
WBC, UA: NONE SEEN (ref 0–?)
pH: 6 (ref 5.0–8.0)

## 2021-04-16 LAB — HEPATITIS C ANTIBODY
Hepatitis C Ab: NONREACTIVE
SIGNAL TO CUT-OFF: <0.02 (ref <1.00)

## 2021-04-16 NOTE — Telephone Encounter (Signed)
-----   Message from Einar Pheasant, MD sent at 04/16/2021  4:11 AM EST ----- Nida Boatman that his cholesterol levels are ok.  Triglycerides are slightly elevated.  Overall sugar control increased some as well.  Continue a low carb diet and exercise. Send [facility] Lipid diet.  Liver enzymes are elevated as well.  I would like to recheck an abdominal ultrasound to further evaluate the liver.  PSA wnl. Hgb, thyroid test and kidney function tests are wnl.

## 2021-04-16 NOTE — Telephone Encounter (Signed)
LMTCB regarding results.  

## 2021-04-20 ENCOUNTER — Telehealth: Payer: Self-pay

## 2021-04-20 NOTE — Telephone Encounter (Signed)
LMTCB regarding lab results.  

## 2021-04-21 NOTE — Telephone Encounter (Signed)
Patient called in stated that he was returning a call to [place]. Please call patient @ [phone]

## 2021-04-22 NOTE — Telephone Encounter (Signed)
See result note.  

## 2021-04-23 ENCOUNTER — Other Ambulatory Visit: Payer: Self-pay | Admitting: Internal Medicine

## 2021-04-23 DIAGNOSIS — R82998 Other abnormal findings in urine: Secondary | ICD-10-CM

## 2021-04-23 DIAGNOSIS — R7989 Other specified abnormal findings of blood chemistry: Secondary | ICD-10-CM

## 2021-04-23 NOTE — Progress Notes (Signed)
Order placed for abdominal ultrasound.   

## 2021-05-04 ENCOUNTER — Telehealth: Payer: Self-pay

## 2021-05-04 ENCOUNTER — Other Ambulatory Visit: Payer: Managed Care, Other (non HMO)

## 2021-05-04 NOTE — Telephone Encounter (Signed)
Called and LVM in regardss to upcoming appt. Would like to know if he has ever had a sleep study.

## 2021-05-05 ENCOUNTER — Encounter: Payer: Self-pay | Admitting: Adult Health

## 2021-05-05 ENCOUNTER — Ambulatory Visit (INDEPENDENT_AMBULATORY_CARE_PROVIDER_SITE_OTHER): Payer: Managed Care, Other (non HMO) | Admitting: Adult Health

## 2021-05-05 ENCOUNTER — Other Ambulatory Visit: Payer: Self-pay

## 2021-05-05 VITALS — BP 130/80 | HR 89 | Temp 98.3°F | Ht 71.0 in | Wt 298.6 lb

## 2021-05-05 DIAGNOSIS — G4719 Other hypersomnia: Secondary | ICD-10-CM | POA: Diagnosis not present

## 2021-05-05 DIAGNOSIS — R0683 Snoring: Secondary | ICD-10-CM

## 2021-05-05 DIAGNOSIS — G4730 Sleep apnea, unspecified: Secondary | ICD-10-CM | POA: Insufficient documentation

## 2021-05-05 NOTE — Progress Notes (Signed)
@Patient  ID: Derek Griffith, male    DOB: February 23, 1966, 56 y.o.   MRN: [id number]  Chief Complaint  Patient presents with   Sleep consult    Referring provider: Einar Pheasant, MD  HPI: 56 year old male seen for sleep consult May 05, 2021 for snoring and daytime sleepiness  TEST/EVENTS :   05/05/2021 Sleep consult  Patient presents for a sleep consult.  Patient complains of ongoing snoring and daytime sleepiness.  Epworth score is 9.  Patient gets sleepy with sitting still, watching TV being inactive.    And gets sleepy after eating lunch.  Typically goes to bed about 9 to 10 PM.  Takes a few minutes to go to sleep.  Is up 1-2 times at night.  And is up at 6 AM.  Patient does not operate heavy machinery on a regular basis.  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  He has never had a sleep study before.  No symptoms suspicious for cataplexy or sleep paralysis. Snoring is keeping his wife up , wife has osa on cpap .  Caffeine intake 2 cups daily .  Weight is at 298 pounds, BMI 41  Medical history is significant for hypertension .  No history of congestive heart failure or stroke  Surgical history negative   Social history patient is married.  Has children--adult .  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  Patient is a never smoker.  Denies alcohol use.  Family history positive for coronary artery disease   Allergies  Allergen Reactions   No Known Drug Allergy     Immunization History  Administered Date(s) Administered   Influenza Split 01/06/2016   Influenza,inj,Quad PF,6+ Mos 12/07/2018   Influenza-Unspecified 11/24/2020   PFIZER(Purple Top)SARS-COV-2 Vaccination 06/08/2019, 07/03/2019    Past Medical History:  Diagnosis Date   Environmental allergies    Hypertension    Nephrolithiasis     Tobacco History: Social History   Tobacco Use  Smoking Status Never  Smokeless Tobacco Never    Counseling given: Not Answered   Outpatient Medications Prior to Visit  Medication Sig Dispense Refill   fish oil-omega-3 fatty acids 1000 MG capsule Take 1 g by mouth daily.     fluticasone (FLONASE) 50 MCG/ACT nasal spray Place 2 sprays into both nostrils daily. (Patient taking differently: Place 2 sprays into both nostrils as needed.) 16 g 2   lisinopril (ZESTRIL) 20 MG tablet Take 1 tablet (20 mg total) by mouth daily. 90 tablet 1   metoprolol tartrate (LOPRESSOR) 25 MG tablet Take 1 tablet (25 mg total) by mouth 2 (two) times daily. 180 tablet 1   Multiple Vitamin (MULTIVITAMIN) tablet Take 1 tablet by mouth daily.     Turmeric 500 MG TABS Take by mouth as needed.     No facility-administered medications prior to visit.     Review of Systems:   Constitutional:   No  weight loss, night sweats,  Fevers, chills,  ++fatigue, or  lassitude.  HEENT:   No headaches,  Difficulty swallowing,  Tooth/dental problems, or  Sore throat,                No sneezing, itching, ear ache, nasal congestion, post nasal drip,   CV:  No chest pain,  Orthopnea, PND, swelling in lower extremities, anasarca, dizziness, palpitations, syncope.   GI  No heartburn, indigestion, abdominal pain, nausea, vomiting, diarrhea, change in bowel habits, loss of appetite, bloody stools.   Resp:  No shortness of breath with exertion or at rest.  No excess mucus, no productive cough,  No non-productive cough,  No coughing up of blood.  No change in color of mucus.  No wheezing.  No chest wall deformity  Skin: no rash or lesions.  GU: no dysuria, change in color of urine, no urgency or frequency.  No flank pain, no hematuria   MS:  No joint pain or swelling.  No decreased range of motion.  No back pain.    Physical Exam  BP 130/80 (BP Location: Left Arm, Cuff Size: Large)    Pulse 89    Temp 98.3 F (36.8 C) (Temporal)    Ht 5\' 11"  (1.803 m)    Wt 298 lb 9.6 oz (135.4 kg)    SpO2 98%    BMI 41.65 kg/m   GEN:  A/Ox3; pleasant , NAD, well nourished    HEENT:  [place]/AT,  , NOSE-clear, THROAT-clear, no lesions, no postnasal drip or exudate noted. Class 2-3 MP airway   NECK:  Supple w/ fair ROM; no JVD; normal carotid impulses w/o bruits; no thyromegaly or nodules palpated; no lymphadenopathy.    RESP  Clear  P & A; w/o, wheezes/ rales/ or rhonchi. no accessory muscle use, no dullness to percussion  CARD:  RRR, no m/r/g, no peripheral edema, pulses intact, no cyanosis or clubbing.  GI:   Soft & nt; nml bowel sounds; no organomegaly or masses detected.   Musco: Warm bil, no deformities or joint swelling noted.   Neuro: alert, no focal deficits noted.    Skin: Warm, no lesions or rashes    Lab Results:  CBC    Component Value Date/Time   WBC 5.0 04/15/2021 0958   RBC 5.22 04/15/2021 0958   HGB 14.9 04/15/2021 0958   HCT 45.6 04/15/2021 0958   PLT 236.0 04/15/2021 0958   MCV 87.5 04/15/2021 0958   MCHC 32.6 04/15/2021 0958   RDW 13.6 04/15/2021 0958   LYMPHSABS 1.5 04/15/2021 0958   MONOABS 0.5 04/15/2021 0958   EOSABS 0.1 04/15/2021 0958   BASOSABS 0.0 04/15/2021 0958    BMET    Component Value Date/Time   NA 138 04/15/2021 0958   K 4.4 04/15/2021 0958   CL 103 04/15/2021 0958   CO2 27 04/15/2021 0958   GLUCOSE 117 (H) 04/15/2021 0958   BUN 12 04/15/2021 0958   CREATININE 0.99 04/15/2021 0958   CALCIUM 9.7 04/15/2021 0958    BNP No results found for: BNP  ProBNP No results found for: PROBNP  Imaging: No results found.    No flowsheet data found.  No results found for: NITRICOXIDE      Assessment & Plan:   Excessive daytime sleepiness Excessive daytime sleepiness, snoring, morbid obesity with BMI of 41 all suspicious for underlying sleep apnea.  Patient education on sleep apnea.  - discussed how weight can impact sleep and risk for sleep disordered breathing - discussed options to assist with weight loss: combination of diet modification, cardiovascular  and strength training exercises   - had an extensive discussion regarding the adverse health consequences related to untreated sleep disordered breathing - specifically discussed the risks for hypertension, coronary artery disease, cardiac dysrhythmias, cerebrovascular disease, and diabetes - lifestyle modification discussed   - discussed how sleep disruption can increase risk of accidents, particularly when driving - safe driving practices were discussed   Set up for home sleep study  Plan  Patient Instructions  Recommend home sleep study  Healthy sleep regimen Do not drive if sleepy Follow-up in 6-8 weeks to review results and go over treatment plan.       Morbid obesity ([place]) Healthy weight loss      Rexene Edison, NP 05/05/2021

## 2021-05-05 NOTE — Assessment & Plan Note (Signed)
Excessive daytime sleepiness, snoring, morbid obesity with BMI of 41 all suspicious for underlying sleep apnea.  Patient education on sleep apnea.  - discussed how weight can impact sleep and risk for sleep disordered breathing - discussed options to assist with weight loss: combination of diet modification, cardiovascular and strength training exercises   - had an extensive discussion regarding the adverse health consequences related to untreated sleep disordered breathing - specifically discussed the risks for hypertension, coronary artery disease, cardiac dysrhythmias, cerebrovascular disease, and diabetes - lifestyle modification discussed   - discussed how sleep disruption can increase risk of accidents, particularly when driving - safe driving practices were discussed   Set up for home sleep study  Plan  Patient Instructions  Recommend home sleep study Healthy sleep regimen Do not drive if sleepy Follow-up in 6-8 weeks to review results and go over treatment plan.

## 2021-05-05 NOTE — Assessment & Plan Note (Signed)
Healthy weight loss 

## 2021-05-05 NOTE — Patient Instructions (Addendum)
Recommend home sleep study Healthy sleep regimen Do not drive if sleepy Follow-up in 6-8 weeks to review results and go over treatment plan.

## 2021-05-08 ENCOUNTER — Ambulatory Visit: Payer: Managed Care, Other (non HMO)

## 2021-08-06 NOTE — Telephone Encounter (Signed)
This order was faxed to [facility] per [facility] because we were 6 weeks out at that time. I have refaxed the order to [facility] with the new insurance information

## 2021-08-06 NOTE — Telephone Encounter (Signed)
Derek Griffith, please advise. Thanks   Derek Griffith  P [facility] (supporting Parrett, Tammy S, NP) 27 minutes ago (10:39 AM)   I had my initial visit in February but have never received a call about a home sleep test.  My insurance has changed to [facility] as of 06/20/21. I have updated MyChart with that information. I assume that you will need to get preauthorization for the sleep test. Let me know when I can get on the schedule for the test. [phone].

## 2021-08-26 DIAGNOSIS — R400 Somnolence: Secondary | ICD-10-CM | POA: Diagnosis not present

## 2021-08-26 DIAGNOSIS — R0683 Snoring: Secondary | ICD-10-CM | POA: Diagnosis not present

## 2021-08-28 ENCOUNTER — Ambulatory Visit: Payer: BC Managed Care – PPO | Attending: Otolaryngology

## 2021-08-28 DIAGNOSIS — R400 Somnolence: Secondary | ICD-10-CM | POA: Diagnosis not present

## 2021-08-28 DIAGNOSIS — R0683 Snoring: Secondary | ICD-10-CM | POA: Insufficient documentation

## 2021-08-28 DIAGNOSIS — G4733 Obstructive sleep apnea (adult) (pediatric): Secondary | ICD-10-CM | POA: Diagnosis not present

## 2021-08-28 DIAGNOSIS — Z6841 Body Mass Index (BMI) 40.0 and over, adult: Secondary | ICD-10-CM | POA: Diagnosis not present

## 2021-09-03 ENCOUNTER — Telehealth: Payer: Self-pay

## 2021-09-03 NOTE — Telephone Encounter (Signed)
HST received from [facility] and faxed to [facility] in [place] for review.

## 2021-09-08 NOTE — Telephone Encounter (Signed)
Lm for patient.  

## 2021-09-08 NOTE — Telephone Encounter (Signed)
Home sleep study done on August 28, 2021 showed moderate obstructive sleep apnea with AHI 24.4/hour and SPO2 low at 86%. Please set up office visit to discuss sleep study results and treatment plan

## 2021-09-09 NOTE — Telephone Encounter (Signed)
Please see 09/09/2021 mychart message.

## 2021-09-10 NOTE — Telephone Encounter (Signed)
Lm for patient to schedule OV.

## 2021-09-11 ENCOUNTER — Telehealth: Payer: Self-pay | Admitting: Adult Health

## 2021-09-28 ENCOUNTER — Ambulatory Visit (INDEPENDENT_AMBULATORY_CARE_PROVIDER_SITE_OTHER): Payer: BC Managed Care – PPO | Admitting: Internal Medicine

## 2021-09-28 ENCOUNTER — Ambulatory Visit (INDEPENDENT_AMBULATORY_CARE_PROVIDER_SITE_OTHER): Payer: BC Managed Care – PPO

## 2021-09-28 ENCOUNTER — Encounter: Payer: Self-pay | Admitting: Internal Medicine

## 2021-09-28 VITALS — BP 140/78 | HR 68 | Temp 98.4°F | Resp 17 | Ht 71.0 in | Wt 288.6 lb

## 2021-09-28 DIAGNOSIS — R7989 Other specified abnormal findings of blood chemistry: Secondary | ICD-10-CM

## 2021-09-28 DIAGNOSIS — M545 Low back pain, unspecified: Secondary | ICD-10-CM | POA: Diagnosis not present

## 2021-09-28 DIAGNOSIS — I10 Essential (primary) hypertension: Secondary | ICD-10-CM

## 2021-09-28 DIAGNOSIS — R739 Hyperglycemia, unspecified: Secondary | ICD-10-CM

## 2021-09-28 DIAGNOSIS — R945 Abnormal results of liver function studies: Secondary | ICD-10-CM

## 2021-09-28 DIAGNOSIS — R109 Unspecified abdominal pain: Secondary | ICD-10-CM | POA: Diagnosis not present

## 2021-09-28 DIAGNOSIS — D649 Anemia, unspecified: Secondary | ICD-10-CM

## 2021-09-28 DIAGNOSIS — D72819 Decreased white blood cell count, unspecified: Secondary | ICD-10-CM

## 2021-09-28 DIAGNOSIS — Z86010 Personal history of colonic polyps: Secondary | ICD-10-CM

## 2021-09-28 LAB — CBC WITH DIFFERENTIAL/PLATELET
Basophils Absolute: 0 10*3/uL (ref 0.0–0.1)
Basophils Relative: 0.7 % (ref 0.0–3.0)
Eosinophils Absolute: 0.1 10*3/uL (ref 0.0–0.7)
Eosinophils Relative: 1.6 % (ref 0.0–5.0)
HCT: 44 % (ref 39.0–52.0)
Hemoglobin: 14.9 g/dL (ref 13.0–17.0)
Lymphocytes Relative: 22.8 % (ref 12.0–46.0)
Lymphs Abs: 1.3 10*3/uL (ref 0.7–4.0)
MCHC: 33.9 g/dL (ref 30.0–36.0)
MCV: 87.6 fl (ref 78.0–100.0)
Monocytes Absolute: 0.6 10*3/uL (ref 0.1–1.0)
Monocytes Relative: 11.2 % (ref 3.0–12.0)
Neutro Abs: 3.7 10*3/uL (ref 1.4–7.7)
Neutrophils Relative %: 63.7 % (ref 43.0–77.0)
Platelets: 223 10*3/uL (ref 150.0–400.0)
RBC: 5.02 Mil/uL (ref 4.22–5.81)
RDW: 13.5 % (ref 11.5–15.5)
WBC: 5.7 10*3/uL (ref 4.0–10.5)

## 2021-09-28 LAB — BASIC METABOLIC PANEL
BUN: 16 mg/dL (ref 6–23)
CO2: 29 mEq/L (ref 19–32)
Calcium: 9.6 mg/dL (ref 8.4–10.5)
Chloride: 102 mEq/L (ref 96–112)
Creatinine, Ser: 1.2 mg/dL (ref 0.40–1.50)
GFR: 67.08 mL/min (ref >60.00)
Glucose, Bld: 109 mg/dL — ABNORMAL HIGH (ref 70–99)
Potassium: 4.5 mEq/L (ref 3.5–5.1)
Sodium: 139 mEq/L (ref 135–145)

## 2021-09-28 LAB — HEPATIC FUNCTION PANEL
ALT: 46 U/L (ref 0–53)
AST: 29 U/L (ref 0–37)
Albumin: 4.7 g/dL (ref 3.5–5.2)
Alkaline Phosphatase: 48 U/L (ref 39–117)
Bilirubin, Direct: 0.2 mg/dL (ref 0.0–0.3)
Total Bilirubin: 0.7 mg/dL (ref 0.2–1.2)
Total Protein: 7.3 g/dL (ref 6.0–8.3)

## 2021-09-28 LAB — LIPID PANEL
Cholesterol: 144 mg/dL (ref 0–200)
HDL: 47.4 mg/dL (ref >39.00)
LDL Cholesterol: 68 mg/dL (ref 0–99)
NonHDL: 96.5
Total CHOL/HDL Ratio: 3
Triglycerides: 141 mg/dL (ref 0.0–149.0)
VLDL: 28.2 mg/dL (ref 0.0–40.0)

## 2021-09-28 LAB — HEMOGLOBIN A1C: Hgb A1c MFr Bld: 6.2 % (ref 4.6–6.5)

## 2021-09-28 MED ORDER — METOPROLOL TARTRATE 25 MG PO TABS
25.0000 mg | ORAL_TABLET | Freq: Two times a day (BID) | ORAL | 1 refills | Status: DC
Start: 2021-09-28 — End: 2022-08-06

## 2021-09-28 MED ORDER — LISINOPRIL 20 MG PO TABS: 20.0000 mg | ORAL_TABLET | Freq: Every day | ORAL | 1 refills | Status: DC

## 2021-09-28 NOTE — Telephone Encounter (Signed)
Pt advised as per Judson Roch

## 2021-09-28 NOTE — Progress Notes (Signed)
[name], male   DOB: June 16, 1965, 56 y.o.   MRN: [id number]   Subjective:    [name], male    DOB: Aug 17, 1965, 56 y.o.   MRN: [id number]   Patient here for a scheduled follow up.   Chief Complaint  Patient presents with   Hypertension   Flank Pain   .   HPI Reports he has been doing relatively well until recently.  Noticed this past weekend - right side pain.  Increased pain.  Feels similar to previous kidney stone pain.  Had rx for tamsulosin and tramadol.  Took both x 1.  Felt better.  No significant pain yesterday.  More pain today.  Has been drinking an increased amount of soft drinks.  No blood in urine.  No abdominal pain.  No vomiting.  No chest pain or sob.  Planning to follow up tomorrow with pulmonary regarding sleep study results.  Had sleep study 08/28/21 - moderate sleep apnea.     Past Medical History:  Diagnosis Date   Environmental allergies    Hypertension    Nephrolithiasis    Past Surgical History:  Procedure Laterality Date   COLONOSCOPY WITH PROPOFOL N/A 06/16/2015   Procedure: COLONOSCOPY WITH PROPOFOL;  Surgeon: Manya Silvas, MD;  Location: [facility] ENDOSCOPY;  Service: Endoscopy;  Laterality: N/A;   Family History  Problem Relation Age of Onset   Hypertension Father    Hypertension Mother    Diabetes Mother    Down syndrome Sister    Prostate cancer Neg Hx    Colon cancer Neg Hx    Social History   Socioeconomic History   Marital status: Married    Spouse name: Not on file   Number of children: 2   Years of education: Not on file   Highest education level: Not on file  Occupational History   Not on file  Tobacco Use   Smoking status: Never   Smokeless tobacco: Never  Substance and Sexual Activity   Alcohol use: No    Alcohol/week: 0.0 standard drinks of alcohol   Drug use: No   Sexual activity: Not on file  Other Topics Concern   Not on file  Social History Narrative   Not on file   Social  Determinants of Health   Financial Resource Strain: Not on file  Food Insecurity: Not on file  Transportation Needs: Not on file  Physical Activity: Not on file  Stress: Not on file  Social Connections: Not on file     Review of Systems  Constitutional:  Negative for appetite change and unexpected weight change.  HENT:  Negative for congestion and sinus pressure.   Respiratory:  Negative for cough, chest tightness and shortness of breath.   Cardiovascular:  Negative for chest pain, palpitations and leg swelling.  Gastrointestinal:  Negative for abdominal pain, diarrhea, nausea and vomiting.  Genitourinary:  Negative for difficulty urinating and dysuria.  Musculoskeletal:  Positive for back pain. Negative for joint swelling and myalgias.  Skin:  Negative for color change and rash.  Neurological:  Negative for dizziness, light-headedness and headaches.  Psychiatric/Behavioral:  Negative for agitation and dysphoric mood.        Objective:     BP 140/78 (BP Location: Left Arm, Patient Position: Sitting, Cuff Size: Large)   Pulse 68   Temp 98.4 F (36.9 C) (Temporal)   Resp 17   Ht 5' 11" (1.803 m)   Wt 288 lb 9.6 oz (  130.9 kg)   SpO2 97%   BMI 40.25 kg/m  Wt Readings from Last 3 Encounters:  09/28/21 288 lb 9.6 oz (130.9 kg)  05/05/21 298 lb 9.6 oz (135.4 kg)  03/31/21 292 lb (132.5 kg)    Physical Exam Constitutional:      General: He is not in acute distress.    Appearance: Normal appearance. He is well-developed.  HENT:     Head: Normocephalic and atraumatic.     Right Ear: External ear normal.     Left Ear: External ear normal.  Eyes:     General: No scleral icterus.       Right eye: No discharge.        Left eye: No discharge.  Cardiovascular:     Rate and Rhythm: Normal rate and regular rhythm.  Pulmonary:     Effort: Pulmonary effort is normal. No respiratory distress.     Breath sounds: Normal breath sounds.  Abdominal:     General: Bowel sounds are  normal.     Palpations: Abdomen is soft.     Tenderness: There is no abdominal tenderness.  Musculoskeletal:        General: No swelling or tenderness.     Cervical back: Neck supple. No tenderness.     Comments: No cva tenderness.   Lymphadenopathy:     Cervical: No cervical adenopathy.  Skin:    Findings: No erythema or rash.  Neurological:     Mental Status: He is alert.  Psychiatric:        Mood and Affect: Mood normal.        Behavior: Behavior normal.      Outpatient Encounter Medications as of 09/28/2021  Medication Sig   fish oil-omega-3 fatty acids 1000 MG capsule Take 1 g by mouth daily.   fluticasone (FLONASE) 50 MCG/ACT nasal spray Place 2 sprays into both nostrils daily. (Patient taking differently: Place 2 sprays into both nostrils as needed.)   Multiple Vitamin (MULTIVITAMIN) tablet Take 1 tablet by mouth daily.   Turmeric 500 MG TABS Take by mouth as needed.   [DISCONTINUED] lisinopril (ZESTRIL) 20 MG tablet Take 1 tablet (20 mg total) by mouth daily.   [DISCONTINUED] metoprolol tartrate (LOPRESSOR) 25 MG tablet Take 1 tablet (25 mg total) by mouth 2 (two) times daily.   lisinopril (ZESTRIL) 20 MG tablet Take 1 tablet (20 mg total) by mouth daily.   metoprolol tartrate (LOPRESSOR) 25 MG tablet Take 1 tablet (25 mg total) by mouth 2 (two) times daily.   No facility-administered encounter medications on file as of 09/28/2021.     Lab Results  Component Value Date   WBC 5.7 09/28/2021   HGB 14.9 09/28/2021   HCT 44.0 09/28/2021   PLT 223.0 09/28/2021   GLUCOSE 109 (H) 09/28/2021   CHOL 144 09/28/2021   TRIG 141.0 09/28/2021   HDL 47.40 09/28/2021   LDLCALC 68 09/28/2021   ALT 46 09/28/2021   AST 29 09/28/2021   NA 139 09/28/2021   K 4.5 09/28/2021   CL 102 09/28/2021   CREATININE 1.20 09/28/2021   BUN 16 09/28/2021   CO2 29 09/28/2021   TSH 1.04 04/15/2021   PSA 0.13 04/15/2021   HGBA1C 6.2 09/28/2021       Assessment & Plan:   Problem List  Items Addressed This Visit     Abnormal liver function    Previous ultrasound revealed fatty liver.  Have discussed diet and exercise.  Check liver panel.  Anemia    Follow cbc.       History of colonic polyps    Colonoscopy 05/2015.  Recommended f/u colonoscopy 05/2020. Overdue.  Plan for f/u with GI.  Will need to get acute issue sorted through first.       Hyperglycemia    Low carb diet and exercise.  Follow met b and a1c.       Relevant Orders   HgB A1c (Completed)   Hypertension    Blood pressure as outlined. Feel is elevated secondary to increased pain - kidney stone.  Continue metoprolol and lisinopril.  Follow pressures.  Follow metabolic panel.        Relevant Medications   metoprolol tartrate (LOPRESSOR) 25 MG tablet   lisinopril (ZESTRIL) 20 MG tablet   Other Relevant Orders   Basic Metabolic Panel (BMET) (Completed)   Lipid Profile (Completed)   Leukopenia    Follow cbc.       Low back pain    Low back pain as outlined.  No radiation.  Feels similar to previous kidney stone pain.  Discussed further w/up.  Discussed CT.  He prefers to hold on CT scanning.  Check labs, including cbc and metabolic panel.  Check KUB.  Has tamsulosin and tramadol.  States does not need refill.  Follow.  Call with update.  Has strainer.        Relevant Orders   DG Abd 1 View (Completed)   CBC with Differential/Platelet (Completed)   Urinalysis, Routine w reflex microscopic   Other Visit Diagnoses     Abnormal liver function tests    -  Primary   Relevant Orders   Hepatic function panel (Completed)        Einar Pheasant, MD

## 2021-09-28 NOTE — Telephone Encounter (Signed)
Please call him and notify him of his xray results (no stone visualized).  He can take tylenol in between doses of tramadol, but I would not take the tramadol any more frequent than q 6 hours. If pain persists, may need CT scan as we discussed.  See xray result note.

## 2021-09-29 ENCOUNTER — Telehealth: Payer: Self-pay

## 2021-09-29 ENCOUNTER — Encounter: Payer: Self-pay | Admitting: Adult Health

## 2021-09-29 ENCOUNTER — Telehealth (INDEPENDENT_AMBULATORY_CARE_PROVIDER_SITE_OTHER): Payer: BC Managed Care – PPO | Admitting: Adult Health

## 2021-09-29 DIAGNOSIS — G4733 Obstructive sleep apnea (adult) (pediatric): Secondary | ICD-10-CM | POA: Diagnosis not present

## 2021-09-29 NOTE — Telephone Encounter (Signed)
Video visit completed.

## 2021-09-29 NOTE — Progress Notes (Signed)
Reviewed and agree with assessment/plan.   Chesley Mires, MD [facility] Pulmonary/Critical Care 09/29/2021, 3:13 PM Pager:  [phone]

## 2021-09-29 NOTE — Telephone Encounter (Signed)
Sleep study received from [facility] and faxed to Rexene Edison, NP for review.

## 2021-09-29 NOTE — Patient Instructions (Addendum)
Begin CPAP at bedtime, wear all night long.  Need to get in at least 6 or more hours of sleep each night Healthy sleep regimen as discussed Do not drive if sleepy Work on healthy weight loss Follow[facility]up in 3 months and [facility]

## 2021-09-29 NOTE — Addendum Note (Signed)
Addended by: Vanessa Barbara on: 09/29/2021 04:03 PM   Modules accepted: Orders

## 2021-09-29 NOTE — Progress Notes (Signed)
Virtual Visit via Video Note  I connected with Derek Griffith on 09/29/21 at  2:00 PM EDT by a video enabled telemedicine application and verified that I am speaking with the correct person using two identifiers.  Location: Patient: Home  Provider: Office    I discussed the limitations of evaluation and management by telemedicine and the availability of in person appointments. The patient expressed understanding and agreed to proceed.  History of Present Illness: 56 year old male seen for sleep consult May 05, 2021 for snoring and daytime sleepiness set up for home sleep study completed on August 28, 2021 with moderate sleep apnea.  Home sleep study showed AHI at 24/hour and SPO2 low at 87%.  We discussed his sleep study results and went over treatment options including weight loss, oral appliance and CPAP.  Patient would like to proceed with CPAP therapy.  Patient education on CPAP was given.  Patient's wife has sleep apnea is on CPAP so he is familiar.  Past Medical History:  Diagnosis Date   Environmental allergies    Hypertension    Nephrolithiasis     Current Outpatient Medications on File Prior to Visit  Medication Sig Dispense Refill   fish oil-omega-3 fatty acids 1000 MG capsule Take 1 g by mouth daily.     fluticasone (FLONASE) 50 MCG/ACT nasal spray Place 2 sprays into both nostrils daily. (Patient taking differently: Place 2 sprays into both nostrils as needed.) 16 g 2   lisinopril (ZESTRIL) 20 MG tablet Take 1 tablet (20 mg total) by mouth daily. 90 tablet 1   metoprolol tartrate (LOPRESSOR) 25 MG tablet Take 1 tablet (25 mg total) by mouth 2 (two) times daily. 180 tablet 1   Multiple Vitamin (MULTIVITAMIN) tablet Take 1 tablet by mouth daily.     Turmeric 500 MG TABS Take by mouth as needed.     No current facility-administered medications on file prior to visit.      Observations/Objective: Home sleep study August 28, 2021 showed AHI at 24/hour and SPO2 low at  87%.  Assessment and Plan: Moderate obstructive sleep apnea.  Patient education given on sleep apnea and treatment options including CPAP.  Patient like to proceed with CPAP. We will begin CPAP AutoSet 5 to 15 cm H2O.,  Mask of choice.  We will use DME in [place]  Morbid obesity encouraged on healthy weight loss  Plan  Patient Instructions  Begin CPAP at bedtime, wear all night long.  Need to get in at least 6 or more hours of sleep each night Healthy sleep regimen as discussed Do not drive if sleepy Work on healthy weight loss Follow-up in 3 months and as needed- [place] office     Follow Up Instructions:    I discussed the assessment and treatment plan with the patient. The patient was provided an opportunity to ask questions and all were answered. The patient agreed with the plan and demonstrated an understanding of the instructions.   The patient was advised to call back or seek an in-person evaluation if the symptoms worsen or if the condition fails to improve as anticipated.  I provided 22  minutes of non-face-to-face time during this encounter.   Rexene Edison, NP

## 2021-09-30 ENCOUNTER — Other Ambulatory Visit: Payer: Self-pay | Admitting: Internal Medicine

## 2021-09-30 ENCOUNTER — Other Ambulatory Visit: Payer: Self-pay

## 2021-09-30 ENCOUNTER — Telehealth: Payer: Self-pay

## 2021-09-30 DIAGNOSIS — R109 Unspecified abdominal pain: Secondary | ICD-10-CM

## 2021-09-30 NOTE — Telephone Encounter (Signed)
Pt w/ persistent flank pain - needs CT per Dr Nicki Reaper . Had KUB , negative. Should order be with or without contrast?  Pt w/ NKDA

## 2021-09-30 NOTE — Telephone Encounter (Signed)
CT renal stone ordered per Dr Derrel Nip

## 2021-10-01 ENCOUNTER — Ambulatory Visit
Admission: RE | Admit: 2021-10-01 | Discharge: 2021-10-01 | Disposition: A | Discharge status: Home / Self Care | Payer: BC Managed Care – PPO | Source: Ambulatory Visit | Attending: Internal Medicine | Admitting: Internal Medicine

## 2021-10-01 ENCOUNTER — Other Ambulatory Visit: Payer: Self-pay

## 2021-10-01 DIAGNOSIS — K429 Umbilical hernia without obstruction or gangrene: Secondary | ICD-10-CM | POA: Diagnosis not present

## 2021-10-01 DIAGNOSIS — N132 Hydronephrosis with renal and ureteral calculous obstruction: Secondary | ICD-10-CM | POA: Diagnosis not present

## 2021-10-01 DIAGNOSIS — K7689 Other specified diseases of liver: Secondary | ICD-10-CM | POA: Diagnosis not present

## 2021-10-01 DIAGNOSIS — R109 Unspecified abdominal pain: Secondary | ICD-10-CM

## 2021-10-01 DIAGNOSIS — I10 Essential (primary) hypertension: Secondary | ICD-10-CM

## 2021-10-01 DIAGNOSIS — M47814 Spondylosis without myelopathy or radiculopathy, thoracic region: Secondary | ICD-10-CM | POA: Diagnosis not present

## 2021-10-02 ENCOUNTER — Telehealth: Payer: Self-pay

## 2021-10-02 ENCOUNTER — Other Ambulatory Visit: Payer: Self-pay | Admitting: Internal Medicine

## 2021-10-02 DIAGNOSIS — N1330 Unspecified hydronephrosis: Secondary | ICD-10-CM | POA: Insufficient documentation

## 2021-10-02 DIAGNOSIS — N131 Hydronephrosis with ureteral stricture, not elsewhere classified: Secondary | ICD-10-CM

## 2021-10-02 MED ORDER — ONDANSETRON HCL 4 MG PO TABS: 4.0000 mg | ORAL_TABLET | Freq: Three times a day (TID) | ORAL | 0 refills | Status: DC | PRN

## 2021-10-02 MED ORDER — TRAMADOL HCL 50 MG PO TABS: 50.0000 mg | ORAL_TABLET | Freq: Four times a day (QID) | ORAL | 0 refills | Status: AC | PRN

## 2021-10-02 MED ORDER — TAMSULOSIN HCL 0.4 MG PO CAPS: 0.4000 mg | ORAL_CAPSULE | Freq: Two times a day (BID) | ORAL | 0 refills | Status: DC

## 2021-10-02 NOTE — Telephone Encounter (Signed)
I don't see any of these meds in his list even historically .  Please advise on doses  or where he got them before

## 2021-10-02 NOTE — Progress Notes (Signed)
He has a kidney stone on the right measuring 6.5 cm which is blocking the right ureter from emptying and causing the right kidney to swell .  I have placed a referral to Urology in the event that he is unable to pass this  He also has a liver lesion that needs additional imaging so she should follow up with Dr Nicki Reaper to arrange this.

## 2021-10-05 ENCOUNTER — Encounter: Payer: Self-pay | Admitting: Internal Medicine

## 2021-10-05 NOTE — Assessment & Plan Note (Signed)
Follow cbc.  

## 2021-10-05 NOTE — Telephone Encounter (Signed)
S/w [facility] - Dr Erlene Quan not in office today , only PA's. Pt would be a new patient, so cannot see PA. Scheduled tomorrow 1030am with Dr Erlene Quan.  Pt advised if fever, chills, increased pain, vomiting, inability to urinate needs to go to ER.  Pt gave verbal understanding and agreement to current plan.

## 2021-10-05 NOTE — Assessment & Plan Note (Signed)
Low carb diet and exercise.  Follow met b and a1c.  

## 2021-10-05 NOTE — Assessment & Plan Note (Signed)
Previous ultrasound revealed fatty liver.  Have discussed diet and exercise.  Check liver panel.

## 2021-10-05 NOTE — Assessment & Plan Note (Signed)
Blood pressure as outlined. Feel is elevated secondary to increased pain - kidney stone.  Continue metoprolol and lisinopril.  Follow pressures.  Follow metabolic panel.

## 2021-10-05 NOTE — Assessment & Plan Note (Signed)
Colonoscopy 05/2015.  Recommended f/u colonoscopy 05/2020. Overdue.  Plan for f/u with GI.  Will need to get acute issue sorted through first.

## 2021-10-05 NOTE — Assessment & Plan Note (Addendum)
Low back pain as outlined.  No radiation.  Feels similar to previous kidney stone pain.  Discussed further w/up.  Discussed CT.  He prefers to hold on CT scanning.  Check labs, including cbc and metabolic panel.  Check KUB.  Has tamsulosin and tramadol.  States does not need refill.  Follow.  Call with update.  Has strainer.

## 2021-10-05 NOTE — Telephone Encounter (Signed)
Noted  Appt with Dr Erlene Quan 10/06/21

## 2021-10-05 NOTE — Telephone Encounter (Signed)
Morning Dr Erlene Quan,  [facility] you are well. Believe you discussed with Dr Derrel Nip patient getting into the office this week.  Patient sent [place] below mychart and is increasing concerned   Luna Fuse,  Do you also want to call [facility] this morning and see if they have any appts available while we wait to hear back from Dr Erlene Quan.  Please call pt and let him know that we are reaching out to [facility]

## 2021-10-06 ENCOUNTER — Other Ambulatory Visit: Payer: Self-pay

## 2021-10-06 ENCOUNTER — Ambulatory Visit
Admission: RE | Admit: 2021-10-06 | Discharge: 2021-10-06 | Disposition: A | Discharge status: Home / Self Care | Payer: BC Managed Care – PPO | Attending: Urology | Admitting: Urology

## 2021-10-06 ENCOUNTER — Ambulatory Visit
Admission: RE | Admit: 2021-10-06 | Discharge: 2021-10-06 | Disposition: A | Discharge status: Home / Self Care | Payer: BC Managed Care – PPO | Source: Ambulatory Visit | Attending: Urology | Admitting: Urology

## 2021-10-06 ENCOUNTER — Ambulatory Visit (INDEPENDENT_AMBULATORY_CARE_PROVIDER_SITE_OTHER): Payer: BC Managed Care – PPO | Admitting: Urology

## 2021-10-06 ENCOUNTER — Encounter: Payer: Self-pay | Admitting: Urology

## 2021-10-06 VITALS — BP 150/83 | HR 63 | Ht 71.0 in | Wt 288.0 lb

## 2021-10-06 DIAGNOSIS — N200 Calculus of kidney: Secondary | ICD-10-CM

## 2021-10-06 DIAGNOSIS — N201 Calculus of ureter: Secondary | ICD-10-CM

## 2021-10-06 LAB — URINALYSIS, COMPLETE
Bilirubin, UA: NEGATIVE
Glucose, UA: NEGATIVE
Ketones, UA: NEGATIVE
Leukocytes,UA: NEGATIVE
Nitrite, UA: NEGATIVE
Protein,UA: NEGATIVE
Specific Gravity, UA: <1.005 — ABNORMAL LOW (ref 1.005–1.030)
Urobilinogen, Ur: 0.2 mg/dL (ref 0.2–1.0)
pH, UA: 6 (ref 5.0–7.5)

## 2021-10-06 LAB — MICROSCOPIC EXAMINATION: Bacteria, UA: NONE SEEN

## 2021-10-06 NOTE — Patient Instructions (Addendum)
Miralax twice a day, if no bowel movement-Mag citrate and enema

## 2021-10-06 NOTE — Progress Notes (Signed)
10/06/21 12:41 PM   Derek Griffith 20-Apr-1965 [id number]  Referring provider:  Einar Pheasant, [address] [place],  [place] [id number] Chief Complaint  Patient presents with   Nephrolithiasis      HPI: Derek Griffith is a 56 y.o.male who presents today for further evaluation of kidney stones.   He saw Dr Derrel Nip with complaints of right flank pain. A CT renal stone study was ordered. He underwent this imaging on 10/01/2021. It visualized there is right hydroureteronephrosis due to obstruction by a 6.5 mm stone in the distal right ureter. Nonobstructing stone is identified in bilateral kidneys. There is no left hydronephrosis. The bilateral adrenal glands are normal. The bladder is normal. She was prescribed Flomax.   CT renal stone study was personally reviewed ad showed 500 Hounsfield unit.   Patient denies any modifying or aggravating factors.  Patient denies any gross hematuria, dysuria. Patient denies any fevers, chills, nausea or vomiting.   He reports that he drinks Dr pepper and dark sodas.   He does have a personal history of stones.  He passed a stone last year and then 2 in the very remote past.  He is never needed surgical intervention.  Continues to have flank pain.  He does not believe he has passed the stone.  The stone is not moved.  Does mention today that he is experience constipation since starting pain medications.  He has not had a bowel movement since Friday.  PMH: Past Medical History:  Diagnosis Date   Environmental allergies    Hypertension    Nephrolithiasis     Surgical History: Past Surgical History:  Procedure Laterality Date   COLONOSCOPY WITH PROPOFOL N/A 06/16/2015   Procedure: COLONOSCOPY WITH PROPOFOL;  Surgeon: Manya Silvas, MD;  Location: [place];  Service: Endoscopy;  Laterality: N/A;    Home Medications:  Allergies as of 10/06/2021       Reactions   No Known Drug Allergy          Medication List        Accurate as of October 06, 2021 12:41 PM. If you have any questions, ask your nurse or doctor.          fish oil-omega-3 fatty acids 1000 MG capsule Take 1 g by mouth daily.   fluticasone 50 MCG/ACT nasal spray Commonly known as: FLONASE Place 2 sprays into both nostrils daily. What changed:  when to take this reasons to take this   lisinopril 20 MG tablet Commonly known as: ZESTRIL Take 1 tablet (20 mg total) by mouth daily.   metoprolol tartrate 25 MG tablet Commonly known as: LOPRESSOR Take 1 tablet (25 mg total) by mouth 2 (two) times daily.   multivitamin tablet Take 1 tablet by mouth daily.   ondansetron 4 MG tablet Commonly known as: Zofran Take 1 tablet (4 mg total) by mouth every 8 (eight) hours as needed for nausea or vomiting.   tamsulosin 0.4 MG Caps capsule Commonly known as: FLOMAX Take 1 capsule (0.4 mg total) by mouth 2 (two) times daily.   traMADol 50 MG tablet Commonly known as: ULTRAM Take 1 tablet (50 mg total) by mouth every 6 (six) hours as needed for up to 7 days.   Turmeric 500 MG Tabs Take by mouth as needed.        Allergies:  Allergies  Allergen Reactions   No Known Drug Allergy     Family History: Family History  Problem Relation  Age of Onset   Hypertension Father    Hypertension Mother    Diabetes Mother    Down syndrome Sister    Prostate cancer Neg Hx    Colon cancer Neg Hx     Social History:  reports that he has never smoked. He has never used smokeless tobacco. He reports that he does not drink alcohol and does not use drugs.   Physical Exam: BP (!) 150/83   Pulse 63   Ht '5\' 11"'$  (1.803 m)   Wt 288 lb (130.6 kg)   BMI 40.17 kg/m   Constitutional:  Alert and oriented, No acute distress. HEENT: Jet AT, moist mucus membranes.  Trachea midline, no masses. Cardiovascular: No clubbing, cyanosis, or edema. Respiratory: Normal respiratory effort, no increased work of breathing. Skin: No  rashes, bruises or suspicious lesions. Neurologic: Grossly intact, no focal deficits, moving all 4 extremities. Psychiatric: Normal mood and affect.  Laboratory Data:  Lab Results  Component Value Date   CREATININE 1.20 09/28/2021   Lab Results  Component Value Date   HGBA1C 6.2 09/28/2021    Urinalysis pending  Pertinent Imaging: CLINICAL DATA:  Right flank pain for 5 days.   EXAM: CT ABDOMEN AND PELVIS WITHOUT CONTRAST   TECHNIQUE: Multidetector CT imaging of the abdomen and pelvis was performed following the standard protocol without IV contrast.   RADIATION DOSE REDUCTION: This exam was performed according to the departmental dose-optimization program which includes automated exposure control, adjustment of the mA and/or kV according to patient size and/or use of iterative reconstruction technique.   COMPARISON:  None Available.   FINDINGS: Lower chest: No acute abnormality.   Hepatobiliary: Questioned subtle 1 cm low-density lesion is identified in the right lobe liver. The gallbladder and biliary tree are normal.   Pancreas: Unremarkable. No pancreatic ductal dilatation or surrounding inflammatory changes.   Spleen: Normal in size without focal abnormality.   Adrenals/Urinary Tract: there is right hydroureteronephrosis due to obstruction by a 6.5 mm stone in the distal right ureter. Nonobstructing stone is identified in bilateral kidneys. There is no left hydronephrosis. The bilateral adrenal glands are normal. The bladder is normal.   Stomach/Bowel: Stomach is within normal limits. Appendix appears normal. No evidence of bowel wall thickening, distention, or inflammatory changes.   Vascular/Lymphatic: Aortic atherosclerosis. No enlarged abdominal or pelvic lymph nodes.   Reproductive: Prostate is unremarkable.   Other: Umbilical herniation of mesenteric fat is noted.   Musculoskeletal: Degenerative joint changes of the thoracic spine are  noted.   IMPRESSION: 1. Right hydroureteronephrosis due to obstruction by a 6.5 mm stone in the distal right ureter. 2. Nonobstructing stone in bilateral kidneys. 3. Questioned subtle 1 cm low-density lesion in the right lobe liver. Consider further evaluation with post-contrast liver CT. 4. Aortic atherosclerosis.   Aortic Atherosclerosis (ICD10-I70.0).     Electronically Signed   By: Abelardo Diesel M.D.   On: 10/02/2021 13:28    I have personally reviewed the images and agree with radiologist interpretation.   Assessment & Plan:    Right 6.5 mm distal ureteral stone  - We discussed various treatment options for urolithiasis including observation with or without medical expulsive therapy, shockwave lithotripsy (SWL), ureteroscopy and laser lithotripsy with stent placement, and percutaneous nephrolithotomy.   We discussed that management is based on stone size, location, density, patient co-morbidities, and patient preference.    Stones <60m in size have a >80% spontaneous passage rate. Data surrounding the use of tamsulosin for medical expulsive therapy  is controversial, but meta analyses suggests it is most efficacious for distal stones between 5-67m in size. Possible side effects include dizziness/lightheadedness, and retrograde ejaculation.   SWL has a lower stone free rate in a single procedure, but also a lower complication rate compared to ureteroscopy and avoids a stent and associated stent related symptoms. Possible complications include renal hematoma, steinstrasse, and need for additional treatment. We discussed the role of his increased skin to stone distance can lead to decreased efficacy with shockwave lithotripsy.   Ureteroscopy with laser lithotripsy and stent placement has a higher stone free rate than SWL in a single procedure, however increased complication rate including possible infection, ureteral injury, bleeding, and stent related morbidity. Common stent related  symptoms include dysuria, urgency/frequency, and flank pain.   After an extensive discussion of the risks and benefits of the above treatment options, the patient would like to proceed with ESWL.  - KUB; scheduled '  2.  Constipation, acute -Secondary to narcotics.  We discussed bowel regimen today including MiraLAX, if this fails either an enema or mag citrate has may be difficult to visualize stone in the setting of retained fecal material, he understands this and will work on having a bowel movement   ISealed Air Corporationas a scribe for AHollice Espy MD.,have documented all relevant documentation on the behalf of AHollice Espy MD,as directed by  AHollice Espy MD while in the presence of AHollice Espy MD.  I have reviewed the above documentation for accuracy and completeness, and I agree with the above.   AHollice Espy MD   [facility] [address] [place]wickBBowman [place] [phone]

## 2021-10-06 NOTE — H&P (View-Only) (Signed)
10/06/21 12:41 PM   Derek Griffith 05-Feb-1966 [id number]  Referring provider:  Einar Pheasant, [address] [place],  [place] [address] Chief Complaint  Patient presents with   Nephrolithiasis      HPI: Derek Griffith is a 56 y.o.male who presents today for further evaluation of kidney stones.   He saw Dr Derrel Nip with complaints of right flank pain. A CT renal stone study was ordered. He underwent this imaging on 10/01/2021. It visualized there is right hydroureteronephrosis due to obstruction by a 6.5 mm stone in the distal right ureter. Nonobstructing stone is identified in bilateral kidneys. There is no left hydronephrosis. The bilateral adrenal glands are normal. The bladder is normal. She was prescribed Flomax.   CT renal stone study was personally reviewed ad showed 500 Hounsfield unit.   Patient denies any modifying or aggravating factors.  Patient denies any gross hematuria, dysuria. Patient denies any fevers, chills, nausea or vomiting.   He reports that he drinks Dr pepper and dark sodas.   He does have a personal history of stones.  He passed a stone last year and then 2 in the very remote past.  He is never needed surgical intervention.  Continues to have flank pain.  He does not believe he has passed the stone.  The stone is not moved.  Does mention today that he is experience constipation since starting pain medications.  He has not had a bowel movement since Friday.  PMH: Past Medical History:  Diagnosis Date   Environmental allergies    Hypertension    Nephrolithiasis     Surgical History: Past Surgical History:  Procedure Laterality Date   COLONOSCOPY WITH PROPOFOL N/A 06/16/2015   Procedure: COLONOSCOPY WITH PROPOFOL;  Surgeon: Manya Silvas, MD;  Location: [place];  Service: Endoscopy;  Laterality: N/A;    Home Medications:  Allergies as of 10/06/2021       Reactions   No Known Drug Allergy          Medication List        Accurate as of October 06, 2021 12:41 PM. If you have any questions, ask your nurse or doctor.          fish oil-omega-3 fatty acids 1000 MG capsule Take 1 g by mouth daily.   fluticasone 50 MCG/ACT nasal spray Commonly known as: FLONASE Place 2 sprays into both nostrils daily. What changed:  when to take this reasons to take this   lisinopril 20 MG tablet Commonly known as: ZESTRIL Take 1 tablet (20 mg total) by mouth daily.   metoprolol tartrate 25 MG tablet Commonly known as: LOPRESSOR Take 1 tablet (25 mg total) by mouth 2 (two) times daily.   multivitamin tablet Take 1 tablet by mouth daily.   ondansetron 4 MG tablet Commonly known as: Zofran Take 1 tablet (4 mg total) by mouth every 8 (eight) hours as needed for nausea or vomiting.   tamsulosin 0.4 MG Caps capsule Commonly known as: FLOMAX Take 1 capsule (0.4 mg total) by mouth 2 (two) times daily.   traMADol 50 MG tablet Commonly known as: ULTRAM Take 1 tablet (50 mg total) by mouth every 6 (six) hours as needed for up to 7 days.   Turmeric 500 MG Tabs Take by mouth as needed.        Allergies:  Allergies  Allergen Reactions   No Known Drug Allergy     Family History: Family History  Problem Relation  Age of Onset   Hypertension Father    Hypertension Mother    Diabetes Mother    Down syndrome Sister    Prostate cancer Neg Hx    Colon cancer Neg Hx     Social History:  reports that he has never smoked. He has never used smokeless tobacco. He reports that he does not drink alcohol and does not use drugs.   Physical Exam: BP (!) 150/83   Pulse 63   Ht '5\' 11"'$  (1.803 m)   Wt 288 lb (130.6 kg)   BMI 40.17 kg/m   Constitutional:  Alert and oriented, No acute distress. HEENT: [place] AT, moist mucus membranes.  Trachea midline, no masses. Cardiovascular: No clubbing, cyanosis, or edema. Respiratory: Normal respiratory effort, no increased work of breathing. Skin: No  rashes, bruises or suspicious lesions. Neurologic: Grossly intact, no focal deficits, moving all 4 extremities. Psychiatric: Normal mood and affect.  Laboratory Data:  Lab Results  Component Value Date   CREATININE 1.20 09/28/2021   Lab Results  Component Value Date   HGBA1C 6.2 09/28/2021    Urinalysis pending  Pertinent Imaging: CLINICAL DATA:  Right flank pain for 5 days.   EXAM: CT ABDOMEN AND PELVIS WITHOUT CONTRAST   TECHNIQUE: Multidetector CT imaging of the abdomen and pelvis was performed following the standard protocol without IV contrast.   RADIATION DOSE REDUCTION: This exam was performed according to the departmental dose-optimization program which includes automated exposure control, adjustment of the mA and/or kV according to patient size and/or use of iterative reconstruction technique.   COMPARISON:  None Available.   FINDINGS: Lower chest: No acute abnormality.   Hepatobiliary: Questioned subtle 1 cm low-density lesion is identified in the right lobe liver. The gallbladder and biliary tree are normal.   Pancreas: Unremarkable. No pancreatic ductal dilatation or surrounding inflammatory changes.   Spleen: Normal in size without focal abnormality.   Adrenals/Urinary Tract: there is right hydroureteronephrosis due to obstruction by a 6.5 mm stone in the distal right ureter. Nonobstructing stone is identified in bilateral kidneys. There is no left hydronephrosis. The bilateral adrenal glands are normal. The bladder is normal.   Stomach/Bowel: Stomach is within normal limits. Appendix appears normal. No evidence of bowel wall thickening, distention, or inflammatory changes.   Vascular/Lymphatic: Aortic atherosclerosis. No enlarged abdominal or pelvic lymph nodes.   Reproductive: Prostate is unremarkable.   Other: Umbilical herniation of mesenteric fat is noted.   Musculoskeletal: Degenerative joint changes of the thoracic spine are  noted.   IMPRESSION: 1. Right hydroureteronephrosis due to obstruction by a 6.5 mm stone in the distal right ureter. 2. Nonobstructing stone in bilateral kidneys. 3. Questioned subtle 1 cm low-density lesion in the right lobe liver. Consider further evaluation with post-contrast liver CT. 4. Aortic atherosclerosis.   Aortic Atherosclerosis (ICD10-I70.0).     Electronically Signed   By: Abelardo Diesel M.D.   On: 10/02/2021 13:28    I have personally reviewed the images and agree with radiologist interpretation.   Assessment & Plan:    Right 6.5 mm distal ureteral stone  - We discussed various treatment options for urolithiasis including observation with or without medical expulsive therapy, shockwave lithotripsy (SWL), ureteroscopy and laser lithotripsy with stent placement, and percutaneous nephrolithotomy.   We discussed that management is based on stone size, location, density, patient co-morbidities, and patient preference.    Stones <43m in size have a >80% spontaneous passage rate. Data surrounding the use of tamsulosin for medical expulsive therapy  is controversial, but meta analyses suggests it is most efficacious for distal stones between 5-64m in size. Possible side effects include dizziness/lightheadedness, and retrograde ejaculation.   SWL has a lower stone free rate in a single procedure, but also a lower complication rate compared to ureteroscopy and avoids a stent and associated stent related symptoms. Possible complications include renal hematoma, steinstrasse, and need for additional treatment. We discussed the role of his increased skin to stone distance can lead to decreased efficacy with shockwave lithotripsy.   Ureteroscopy with laser lithotripsy and stent placement has a higher stone free rate than SWL in a single procedure, however increased complication rate including possible infection, ureteral injury, bleeding, and stent related morbidity. Common stent related  symptoms include dysuria, urgency/frequency, and flank pain.   After an extensive discussion of the risks and benefits of the above treatment options, the patient would like to proceed with ESWL.  - KUB; scheduled '  2.  Constipation, acute -Secondary to narcotics.  We discussed bowel regimen today including MiraLAX, if this fails either an enema or mag citrate has may be difficult to visualize stone in the setting of retained fecal material, he understands this and will work on having a bowel movement   ISealed Air Corporationas a scribe for AHollice Espy MD.,have documented all relevant documentation on the behalf of AHollice Espy MD,as directed by  AHollice Espy MD while in the presence of AHollice Espy MD.  I have reviewed the above documentation for accuracy and completeness, and I agree with the above.   AHollice Espy MD   [facility] [address] [place] [phone]

## 2021-10-06 NOTE — Progress Notes (Signed)
ESWL ORDER FORM  Expected date of procedure: 10/08/2021  Surgeon: Hollice Espy, MD  Post op standing: 2-4wk follow up w/KUB prior  Anticoagulation/Aspirin/NSAID standing order: Hold all 72 hours prior  Anesthesia standing order: MAC  VTE standing: SCD's  Dx: Right Ureteral Stone  Procedure: right Extracorporeal shock wave lithotripsy  CPT : 25672  Standing Order Set:   *NPO after mn, KUB  *NS 14m/hr, Keflex 5032mPO, Benadryl 2554mO, Valium 27m4m, Zofran 4mg 44m   Medications if other than standing orders:   NONE

## 2021-10-07 ENCOUNTER — Encounter: Payer: Self-pay | Admitting: Urology

## 2021-10-07 NOTE — Telephone Encounter (Signed)
FYI Pt saw dr Erlene Quan yesterday

## 2021-10-08 ENCOUNTER — Ambulatory Visit: Payer: BC Managed Care – PPO

## 2021-10-08 ENCOUNTER — Encounter
Admission: RE | Disposition: A | Discharge status: Home / Self Care | Payer: Self-pay | Source: Ambulatory Visit | Attending: Urology

## 2021-10-08 ENCOUNTER — Ambulatory Visit
Admission: RE | Admit: 2021-10-08 | Discharge: 2021-10-08 | Disposition: A | Discharge status: Home / Self Care | Payer: BC Managed Care – PPO | Source: Ambulatory Visit | Attending: Urology | Admitting: Urology

## 2021-10-08 ENCOUNTER — Encounter: Payer: Self-pay | Admitting: Urology

## 2021-10-08 ENCOUNTER — Other Ambulatory Visit: Payer: Self-pay

## 2021-10-08 DIAGNOSIS — N132 Hydronephrosis with renal and ureteral calculous obstruction: Secondary | ICD-10-CM | POA: Diagnosis not present

## 2021-10-08 DIAGNOSIS — N200 Calculus of kidney: Secondary | ICD-10-CM | POA: Diagnosis not present

## 2021-10-08 DIAGNOSIS — N201 Calculus of ureter: Secondary | ICD-10-CM | POA: Diagnosis not present

## 2021-10-08 HISTORY — PX: EXTRACORPOREAL SHOCK WAVE LITHOTRIPSY: SHX1557

## 2021-10-08 SURGERY — LITHOTRIPSY, ESWL
Anesthesia: Moderate Sedation | Laterality: Right

## 2021-10-08 MED ORDER — DIAZEPAM 5 MG PO TABS
10.0000 mg | ORAL_TABLET | ORAL | Status: AC
  Administered 2021-10-08: 10 mg via ORAL

## 2021-10-08 MED ORDER — ONDANSETRON HCL 4 MG/2ML IJ SOLN: 4.0000 mg | Freq: Once | INTRAMUSCULAR | Status: AC

## 2021-10-08 MED ORDER — DIPHENHYDRAMINE HCL 25 MG PO CAPS
25.0000 mg | ORAL_CAPSULE | ORAL | Status: AC
  Administered 2021-10-08: 25 mg via ORAL

## 2021-10-08 MED ORDER — HYDROCODONE-ACETAMINOPHEN 5-325 MG PO TABS: 1.0000 | ORAL_TABLET | Freq: Four times a day (QID) | ORAL | 0 refills | Status: DC | PRN

## 2021-10-08 MED ORDER — DIPHENHYDRAMINE HCL 25 MG PO CAPS
ORAL_CAPSULE | ORAL | Status: AC
  Filled 2021-10-08: qty 1

## 2021-10-08 MED ORDER — CEPHALEXIN 500 MG PO CAPS
500.0000 mg | ORAL_CAPSULE | Freq: Once | ORAL | Status: AC
  Administered 2021-10-08: 500 mg via ORAL

## 2021-10-08 MED ORDER — SODIUM CHLORIDE 0.9 % IV SOLN: INTRAVENOUS | Status: DC

## 2021-10-08 MED ORDER — DIAZEPAM 5 MG PO TABS
ORAL_TABLET | ORAL | Status: AC
  Filled 2021-10-08: qty 2

## 2021-10-08 MED ORDER — ONDANSETRON HCL 4 MG/2ML IJ SOLN
INTRAMUSCULAR | Status: AC
  Administered 2021-10-08: 4 mg via INTRAVENOUS
  Filled 2021-10-08: qty 2

## 2021-10-08 MED ORDER — CEPHALEXIN 500 MG PO CAPS
ORAL_CAPSULE | ORAL | Status: AC
  Filled 2021-10-08: qty 1

## 2021-10-08 NOTE — Interval H&P Note (Signed)
History and Physical Interval Note:  10/08/2021 11:58 AM  Derek Griffith  has presented today for surgery, with the diagnosis of Right Ureteral Stone.  The various methods of treatment have been discussed with the patient and family. After consideration of risks, benefits and other options for treatment, the patient has consented to  Procedure(s): EXTRACORPOREAL SHOCK WAVE LITHOTRIPSY (ESWL) (Right) as a surgical intervention.  The patient's history has been reviewed, patient examined, no change in status, stable for surgery.  I have reviewed the patient's chart and labs.  Questions were answered to the patient's satisfaction.    RRR CTAB  Hollice Espy

## 2021-10-08 NOTE — Discharge Instructions (Addendum)
You have a ureteral stent in place.  This is a tube that extends from your kidney to your bladder.  This may cause urinary bleeding, burning with urination, and urinary frequency.  Please call our office or present to the ED if you develop fevers >101 or pain which is not able to be controlled with oral pain medications.  You may be given either Flomax and/ or ditropan to help with bladder spasms and stent pain in addition to pain medications.    [facility] [address], [place], [place] [address] [phone] AMBULATORY SURGERY  DISCHARGE INSTRUCTIONS   The drugs that you were given will stay in your system until tomorrow so for the next 24 hours you should not:  Drive an automobile Make any legal decisions Drink any alcoholic beverage   You may resume regular meals tomorrow.  Today it is better to start with liquids and gradually work up to solid foods.  You may eat anything you prefer, but it is better to start with liquids, then soup and crackers, and gradually work up to solid foods.   Please notify your doctor immediately if you have any unusual bleeding, trouble breathing, redness and pain at the surgery site, drainage, fever, or pain not relieved by medication.    Additional Instructions:Follow Dr Audree Bane office instructions        Please contact your physician with any problems or Same Day Surgery at [phone], Monday through Friday 6 am to 4 pm, or [place] at [facility] number at [phone].

## 2021-10-09 ENCOUNTER — Encounter: Payer: Self-pay | Admitting: Urology

## 2021-10-09 ENCOUNTER — Encounter: Payer: Self-pay | Admitting: Internal Medicine

## 2021-10-09 LAB — CULTURE, URINE COMPREHENSIVE

## 2021-10-09 NOTE — Telephone Encounter (Signed)
If he prefers to keep f/u -  ok to keep.  Confirm doing ok.

## 2021-10-14 ENCOUNTER — Ambulatory Visit: Payer: BC Managed Care – PPO | Admitting: Internal Medicine

## 2021-10-15 ENCOUNTER — Encounter: Payer: Self-pay | Admitting: Urology

## 2021-10-16 NOTE — Telephone Encounter (Signed)
Called patient to review symptoms. Patient reports frequency and dysuria have improved. Reviewed symptoms post lithotripsy. He has been staying hydrated. Denies any other urinary symptoms. Advised if symptoms worsen to notify the office. Voiced understanding.

## 2021-10-19 DIAGNOSIS — G4733 Obstructive sleep apnea (adult) (pediatric): Secondary | ICD-10-CM | POA: Diagnosis not present

## 2021-10-22 ENCOUNTER — Ambulatory Visit
Admission: RE | Admit: 2021-10-22 | Discharge: 2021-10-22 | Disposition: A | Discharge status: Home / Self Care | Payer: BC Managed Care – PPO | Attending: Urology | Admitting: Urology

## 2021-10-22 ENCOUNTER — Encounter: Payer: Self-pay | Admitting: Physician Assistant

## 2021-10-22 ENCOUNTER — Ambulatory Visit
Admission: RE | Admit: 2021-10-22 | Discharge: 2021-10-22 | Disposition: A | Discharge status: Home / Self Care | Payer: BC Managed Care – PPO | Source: Ambulatory Visit | Attending: Urology | Admitting: Urology

## 2021-10-22 ENCOUNTER — Ambulatory Visit (INDEPENDENT_AMBULATORY_CARE_PROVIDER_SITE_OTHER): Payer: BC Managed Care – PPO | Admitting: Physician Assistant

## 2021-10-22 VITALS — BP 135/89 | HR 67 | Ht 71.0 in | Wt 283.0 lb

## 2021-10-22 DIAGNOSIS — N201 Calculus of ureter: Secondary | ICD-10-CM

## 2021-10-22 DIAGNOSIS — R109 Unspecified abdominal pain: Secondary | ICD-10-CM | POA: Diagnosis not present

## 2021-10-22 NOTE — Progress Notes (Signed)
10/22/2021 10:46 AM   Derek Griffith [birth year] [id number]  CC: Chief Complaint  Patient presents with   Follow-up   Nephrolithiasis   HPI: Derek Griffith is a 56 y.o. male with PMH nephrolithiasis who underwent ESWL with Dr. Erlene Quan on 10/08/2021 for management of a 6.5 mm distal right ureteral stone who presents today for postop follow-up.  Procedure note describes smudging of the stone.  Today he reports he passed multiple fragments between 4 and 7 days following his procedure.  He brings them with him for analysis today.  His pain has resolved and he has no acute concerns today.  He reports extensive fluid intake with dark sodas and tea.  He has been trying to switch to Sprite and water as alternatives.  He thinks his fluid intake is the primary source of his recurrent nephrolithiasis.  KUB today with interval resolution of the distal right ureteral stone.  PMH: Past Medical History:  Diagnosis Date   Environmental allergies    Hypertension    Nephrolithiasis     Surgical History: Past Surgical History:  Procedure Laterality Date   COLONOSCOPY WITH PROPOFOL N/A 06/16/2015   Procedure: COLONOSCOPY WITH PROPOFOL;  Surgeon: Manya Silvas, MD;  Location: [place];  Service: Endoscopy;  Laterality: N/A;   EXTRACORPOREAL SHOCK WAVE LITHOTRIPSY Right 10/08/2021   Procedure: EXTRACORPOREAL SHOCK WAVE LITHOTRIPSY (ESWL);  Surgeon: Hollice Espy, MD;  Location: [facility];  Service: Urology;  Laterality: Right;    Home Medications:  Allergies as of 10/22/2021       Reactions   No Known Drug Allergy         Medication List        Accurate as of October 22, 2021 10:46 AM. If you have any questions, ask your nurse or doctor.          fish oil-omega-3 fatty acids 1000 MG capsule Take 1 g by mouth daily.   fluticasone 50 MCG/ACT nasal spray Commonly known as: FLONASE Place 2 sprays into both nostrils daily.   HYDROcodone-acetaminophen 5-325 MG  tablet Commonly known as: NORCO/VICODIN Take 1-2 tablets by mouth every 6 (six) hours as needed for moderate pain.   lisinopril 20 MG tablet Commonly known as: ZESTRIL Take 1 tablet (20 mg total) by mouth daily.   metoprolol tartrate 25 MG tablet Commonly known as: LOPRESSOR Take 1 tablet (25 mg total) by mouth 2 (two) times daily.   multivitamin tablet Take 1 tablet by mouth daily.   ondansetron 4 MG tablet Commonly known as: Zofran Take 1 tablet (4 mg total) by mouth every 8 (eight) hours as needed for nausea or vomiting.   polyethylene glycol 17 g packet Commonly known as: MIRALAX / GLYCOLAX Take 17 g by mouth daily as needed for moderate constipation.   tamsulosin 0.4 MG Caps capsule Commonly known as: FLOMAX Take 1 capsule (0.4 mg total) by mouth 2 (two) times daily.   Turmeric 500 MG Tabs Take by mouth as needed.        Allergies:  Allergies  Allergen Reactions   No Known Drug Allergy     Family History: Family History  Problem Relation Age of Onset   Hypertension Father    Hypertension Mother    Diabetes Mother    Down syndrome Sister    Prostate cancer Neg Hx    Colon cancer Neg Hx     Social History:   reports that he has never smoked. He has never been exposed to tobacco  smoke. He has never used smokeless tobacco. He reports that he does not drink alcohol and does not use drugs.  Physical Exam: BP 135/89   Pulse 67   Ht '5\' 11"'$  (1.803 m)   Wt 283 lb (128.4 kg)   BMI 39.47 kg/m   Constitutional:  Alert and oriented, no acute distress, nontoxic appearing HEENT: [place], AT Cardiovascular: No clubbing, cyanosis, or edema Respiratory: Normal respiratory effort, no increased work of breathing Skin: No rashes, bruises or suspicious lesions Neurologic: Grossly intact, no focal deficits, moving all 4 extremities Psychiatric: Normal mood and affect  Laboratory Data: Results for orders placed or performed in visit on 10/06/21  CULTURE, URINE  COMPREHENSIVE   Specimen: Urine   UR  Result Value Ref Range   Urine Culture, Comprehensive Final report    Organism ID, Bacteria Comment   Microscopic Examination   Urine  Result Value Ref Range   WBC, UA 0-5 0 - 5 /hpf   RBC, Urine 0-2 0 - 2 /hpf   Epithelial Cells (non renal) 0-10 0 - 10 /hpf   Bacteria, UA None seen None seen/Few  Urinalysis, Complete  Result Value Ref Range   Specific Gravity, UA <1.005 (L) 1.005 - 1.030   pH, UA 6.0 5.0 - 7.5   Color, UA Yellow Yellow   Appearance Ur Clear Clear   Leukocytes,UA Negative Negative   Protein,UA Negative Negative/Trace   Glucose, UA Negative Negative   Ketones, UA Negative Negative   RBC, UA 1+ (A) Negative   Bilirubin, UA Negative Negative   Urobilinogen, Ur 0.2 0.2 - 1.0 mg/dL   Nitrite, UA Negative Negative   Microscopic Examination See below:    Pertinent Imaging: KUB, 10/22/2021: CLINICAL DATA:  Status post lithotripsy on the right. Patient denies pain.   EXAM: ABDOMEN - 1 VIEW   COMPARISON:  October 08, 2021   FINDINGS: The bowel gas pattern is normal. No radio-opaque calculi or other significant radiographic abnormality are seen. Marked severity right-sided facet joint hypertrophy is noted at the level of L5-S1.   IMPRESSION: 1. No residual renal calculi.     Electronically Signed   By: Virgina Norfolk M.D.   On: 10/22/2021 16:04  I personally reviewed the images referenced above and note interval resolution of the distal right ureteral stone.  Assessment & Plan:   1. Right ureteral stone Multiple fragments passed and symptoms have resolved.  No residual stone burden seen on KUB.  We will send his fragments for analysis today and reach out to him via MyChart with stone prevention recommendations.  I agree that his intake of dark sodas and tea are likely contributory to his stone disease.  Patient prefers to follow-up with [place] as needed.  We discussed pursuing a metabolic work-up and he prefers to defer  this pending increased frequency of stone episodes.  I think this is reasonable. - Calculi, with Photograph (to Clinical Lab)  Return if symptoms worsen or fail to improve, for Will share stone analysis results via MyChart.  Debroah Loop, PA-C  [facility] [address] [place], [place] [address] [phone]

## 2021-10-27 ENCOUNTER — Other Ambulatory Visit: Payer: BC Managed Care – PPO

## 2021-10-29 LAB — CALCULI, WITH PHOTOGRAPH (CLINICAL LAB)
Calcium Oxalate Monohydrate: 100 %
Weight Calculi: 74 mg

## 2021-11-03 ENCOUNTER — Encounter: Payer: Self-pay | Admitting: Internal Medicine

## 2021-11-03 DIAGNOSIS — Z1211 Encounter for screening for malignant neoplasm of colon: Secondary | ICD-10-CM

## 2021-11-04 ENCOUNTER — Other Ambulatory Visit (INDEPENDENT_AMBULATORY_CARE_PROVIDER_SITE_OTHER): Payer: BC Managed Care – PPO

## 2021-11-04 DIAGNOSIS — I10 Essential (primary) hypertension: Secondary | ICD-10-CM | POA: Diagnosis not present

## 2021-11-04 LAB — BASIC METABOLIC PANEL
BUN: 11 mg/dL (ref 6–23)
CO2: 28 mEq/L (ref 19–32)
Calcium: 9.2 mg/dL (ref 8.4–10.5)
Chloride: 101 mEq/L (ref 96–112)
Creatinine, Ser: 0.85 mg/dL (ref 0.40–1.50)
GFR: 96.32 mL/min (ref >60.00)
Glucose, Bld: 127 mg/dL — ABNORMAL HIGH (ref 70–99)
Potassium: 4 mEq/L (ref 3.5–5.1)
Sodium: 139 mEq/L (ref 135–145)

## 2021-11-05 NOTE — Telephone Encounter (Signed)
I have placed the order for the GI referral.  Due to his insurance, he request if possible to have done before end of September.

## 2021-11-10 NOTE — Telephone Encounter (Signed)
I have placed an order for referral to [facility].  See note.  I tried to cancel the other referral, but I do not think I was successful.  Wanted you to be aware of new referral.

## 2021-11-10 NOTE — Telephone Encounter (Signed)
Order placed for GI referral to [facility]

## 2021-11-11 ENCOUNTER — Other Ambulatory Visit: Payer: Self-pay

## 2021-11-11 ENCOUNTER — Telehealth: Payer: Self-pay

## 2021-11-11 DIAGNOSIS — Z86010 Personal history of colonic polyps: Secondary | ICD-10-CM

## 2021-11-11 MED ORDER — GOLYTELY 236 G PO SOLR: 4000.0000 mL | Freq: Once | ORAL | 0 refills | Status: AC

## 2021-11-11 NOTE — Telephone Encounter (Signed)
Gastroenterology Pre-Procedure Review  Request Date: 12/08/21 Requesting Physician: Dr. Marius Ditch  PATIENT REVIEW QUESTIONS: The patient responded to the following health history questions as indicated:    1. Are you having any GI issues? no 2. Do you have a personal history of Polyps? yes (2017 performed by Dr. Vira Agar) 3. Do you have a family history of Colon Cancer or Polyps? yes (mother colon polyps) 4. Diabetes Mellitus? no 5. Joint replacements in the past 12 months? Sleep apnea, lithotripsy July 2023 6. Major health problems in the past 3 months?no 7. Any artificial heart valves, MVP, or defibrillator?no    MEDICATIONS & ALLERGIES:    Patient reports the following regarding taking any anticoagulation/antiplatelet therapy:   Plavix, Coumadin, Eliquis, Xarelto, Lovenox, Pradaxa, Brilinta, or Effient? no Aspirin? no  Patient confirms/reports the following medications:  Current Outpatient Medications  Medication Sig Dispense Refill   fish oil-omega-3 fatty acids 1000 MG capsule Take 1 g by mouth daily.     fluticasone (FLONASE) 50 MCG/ACT nasal spray Place 2 sprays into both nostrils daily. 16 g 2   HYDROcodone-acetaminophen (NORCO/VICODIN) 5-325 MG tablet Take 1-2 tablets by mouth every 6 (six) hours as needed for moderate pain. 10 tablet 0   lisinopril (ZESTRIL) 20 MG tablet Take 1 tablet (20 mg total) by mouth daily. 90 tablet 1   metoprolol tartrate (LOPRESSOR) 25 MG tablet Take 1 tablet (25 mg total) by mouth 2 (two) times daily. 180 tablet 1   Multiple Vitamin (MULTIVITAMIN) tablet Take 1 tablet by mouth daily.     ondansetron (ZOFRAN) 4 MG tablet Take 1 tablet (4 mg total) by mouth every 8 (eight) hours as needed for nausea or vomiting. 20 tablet 0   polyethylene glycol (MIRALAX / GLYCOLAX) 17 g packet Take 17 g by mouth daily as needed for moderate constipation.     tamsulosin (FLOMAX) 0.4 MG CAPS capsule Take 1 capsule (0.4 mg total) by mouth 2 (two) times daily. 14 capsule 0    Turmeric 500 MG TABS Take by mouth as needed.     No current facility-administered medications for this visit.    Patient confirms/reports the following allergies:  Allergies  Allergen Reactions   No Known Drug Allergy     No orders of the defined types were placed in this encounter.   AUTHORIZATION INFORMATION Primary Insurance: 1D#: Group #:  Secondary Insurance: 1D#: Group #:  SCHEDULE INFORMATION: Date: 12/08/21 Time: Location: [facility]

## 2021-11-19 DIAGNOSIS — G4733 Obstructive sleep apnea (adult) (pediatric): Secondary | ICD-10-CM | POA: Diagnosis not present

## 2021-11-25 ENCOUNTER — Encounter: Payer: Self-pay | Admitting: Internal Medicine

## 2021-11-25 ENCOUNTER — Ambulatory Visit (INDEPENDENT_AMBULATORY_CARE_PROVIDER_SITE_OTHER): Payer: BC Managed Care – PPO | Admitting: Internal Medicine

## 2021-11-25 DIAGNOSIS — Z86010 Personal history of colonic polyps: Secondary | ICD-10-CM

## 2021-11-25 DIAGNOSIS — G4730 Sleep apnea, unspecified: Secondary | ICD-10-CM

## 2021-11-25 DIAGNOSIS — I10 Essential (primary) hypertension: Secondary | ICD-10-CM | POA: Diagnosis not present

## 2021-11-25 DIAGNOSIS — R945 Abnormal results of liver function studies: Secondary | ICD-10-CM | POA: Diagnosis not present

## 2021-11-25 DIAGNOSIS — G4733 Obstructive sleep apnea (adult) (pediatric): Secondary | ICD-10-CM | POA: Diagnosis not present

## 2021-11-25 DIAGNOSIS — R739 Hyperglycemia, unspecified: Secondary | ICD-10-CM

## 2021-11-25 MED ORDER — LISINOPRIL 40 MG PO TABS: 40.0000 mg | ORAL_TABLET | Freq: Every day | ORAL | 1 refills | Status: DC

## 2021-11-25 NOTE — Patient Instructions (Signed)
Increase lisinopril to '40mg'$  per day.

## 2021-11-25 NOTE — Progress Notes (Signed)
[name], male   DOB: 1965-09-28, 56 y.o.   MRN: [id number]   Subjective:    [name], male    DOB: [birth year], 56 y.o.   MRN: [id number]   Patient here for  Chief Complaint  Patient presents with   Follow-up    8 week   .   HPI Here to follow up regarding his blood pressure.  Recently evaluated by urology for kidney stone. Underwent ESWL with Dr. Erlene Quan on 10/08/2021 for management of a 6.5 mm distal right ureteral stone. He passed multiple fragments between 4 and 7 days following his procedure. His pain has resolved and he has no acute concerns today. Has tried to change from dark sodas to sprite and water.  No chest pain.  Breathing stable.  No acid reflux reported.  No abdominal pain.  Bowels moving.  Blood pressure 139/78 and 128/76.    Past Medical History:  Diagnosis Date   Environmental allergies    Hypertension    Nephrolithiasis    Past Surgical History:  Procedure Laterality Date   COLONOSCOPY WITH PROPOFOL N/A 06/16/2015   Procedure: COLONOSCOPY WITH PROPOFOL;  Surgeon: Manya Silvas, MD;  Location: [facility] ENDOSCOPY;  Service: Endoscopy;  Laterality: N/A;   EXTRACORPOREAL SHOCK WAVE LITHOTRIPSY Right 10/08/2021   Procedure: EXTRACORPOREAL SHOCK WAVE LITHOTRIPSY (ESWL);  Surgeon: Hollice Espy, MD;  Location: [facility] ORS;  Service: Urology;  Laterality: Right;   Family History  Problem Relation Age of Onset   Hypertension Father    Hypertension Mother    Diabetes Mother    Down syndrome Sister    Prostate cancer Neg Hx    Colon cancer Neg Hx    Social History   Socioeconomic History   Marital status: Married    Spouse name: Not on file   Number of children: 2   Years of education: Not on file   Highest education level: Not on file  Occupational History   Not on file  Tobacco Use   Smoking status: Never    Passive exposure: Never   Smokeless tobacco: Never  Substance and Sexual Activity   Alcohol use: No     Alcohol/week: 0.0 standard drinks of alcohol   Drug use: No   Sexual activity: Not on file  Other Topics Concern   Not on file  Social History Narrative   Not on file   Social Determinants of Health   Financial Resource Strain: Not on file  Food Insecurity: Not on file  Transportation Needs: Not on file  Physical Activity: Not on file  Stress: Not on file  Social Connections: Not on file     Review of Systems  Constitutional:  Negative for appetite change and unexpected weight change.  HENT:  Negative for congestion and sinus pressure.   Respiratory:  Negative for cough, chest tightness and shortness of breath.   Cardiovascular:  Negative for chest pain, palpitations and leg swelling.  Gastrointestinal:  Negative for abdominal pain, diarrhea, nausea and vomiting.  Genitourinary:  Negative for difficulty urinating and dysuria.  Musculoskeletal:  Negative for joint swelling and myalgias.  Skin:  Negative for color change and rash.  Neurological:  Negative for dizziness, light-headedness and headaches.  Psychiatric/Behavioral:  Negative for agitation and dysphoric mood.        Objective:     BP 139/78   Pulse 69   Temp 99.2 F (37.3 C) (Oral)   Ht 5' 11"  (1.803 m)   Wt  293 lb 6.4 oz (133.1 kg)   SpO2 98%   BMI 40.92 kg/m  Wt Readings from Last 3 Encounters:  11/25/21 293 lb 6.4 oz (133.1 kg)  10/22/21 283 lb (128.4 kg)  10/08/21 283 lb (128.4 kg)    Physical Exam Constitutional:      General: He is not in acute distress.    Appearance: Normal appearance. He is well-developed.  HENT:     Head: Normocephalic and atraumatic.     Right Ear: External ear normal.     Left Ear: External ear normal.  Eyes:     General: No scleral icterus.       Right eye: No discharge.        Left eye: No discharge.  Cardiovascular:     Rate and Rhythm: Normal rate and regular rhythm.  Pulmonary:     Effort: Pulmonary effort is normal. No respiratory distress.     Breath  sounds: Normal breath sounds.  Abdominal:     General: Bowel sounds are normal.     Palpations: Abdomen is soft.     Tenderness: There is no abdominal tenderness.  Musculoskeletal:        General: No swelling or tenderness.     Cervical back: Neck supple. No tenderness.  Lymphadenopathy:     Cervical: No cervical adenopathy.  Skin:    Findings: No erythema or rash.  Neurological:     Mental Status: He is alert.  Psychiatric:        Mood and Affect: Mood normal.        Behavior: Behavior normal.      Outpatient Encounter Medications as of 11/25/2021  Medication Sig   fish oil-omega-3 fatty acids 1000 MG capsule Take 1 g by mouth daily.   lisinopril (ZESTRIL) 40 MG tablet Take 1 tablet (40 mg total) by mouth daily.   metoprolol tartrate (LOPRESSOR) 25 MG tablet Take 1 tablet (25 mg total) by mouth 2 (two) times daily.   Multiple Vitamin (MULTIVITAMIN) tablet Take 1 tablet by mouth daily.   [DISCONTINUED] lisinopril (ZESTRIL) 20 MG tablet Take 1 tablet (20 mg total) by mouth daily.   [DISCONTINUED] fluticasone (FLONASE) 50 MCG/ACT nasal spray Place 2 sprays into both nostrils daily.   [DISCONTINUED] HYDROcodone-acetaminophen (NORCO/VICODIN) 5-325 MG tablet Take 1-2 tablets by mouth every 6 (six) hours as needed for moderate pain.   [DISCONTINUED] ondansetron (ZOFRAN) 4 MG tablet Take 1 tablet (4 mg total) by mouth every 8 (eight) hours as needed for nausea or vomiting.   [DISCONTINUED] polyethylene glycol (MIRALAX / GLYCOLAX) 17 g packet Take 17 g by mouth daily as needed for moderate constipation.   [DISCONTINUED] tamsulosin (FLOMAX) 0.4 MG CAPS capsule Take 1 capsule (0.4 mg total) by mouth 2 (two) times daily.   [DISCONTINUED] Turmeric 500 MG TABS Take by mouth as needed.   No facility-administered encounter medications on file as of 11/25/2021.     Lab Results  Component Value Date   WBC 5.7 09/28/2021   HGB 14.9 09/28/2021   HCT 44.0 09/28/2021   PLT 223.0 09/28/2021    GLUCOSE 127 (H) 11/04/2021   CHOL 144 09/28/2021   TRIG 141.0 09/28/2021   HDL 47.40 09/28/2021   LDLCALC 68 09/28/2021   ALT 46 09/28/2021   AST 29 09/28/2021   NA 139 11/04/2021   K 4.0 11/04/2021   CL 101 11/04/2021   CREATININE 0.85 11/04/2021   BUN 11 11/04/2021   CO2 28 11/04/2021   TSH 1.04 04/15/2021  PSA 0.13 04/15/2021   HGBA1C 6.2 09/28/2021    Abdomen 1 view (KUB)  Result Date: 10/22/2021 CLINICAL DATA:  Status post lithotripsy on the right. Patient denies pain. EXAM: ABDOMEN - 1 VIEW COMPARISON:  October 08, 2021 FINDINGS: The bowel gas pattern is normal. No radio-opaque calculi or other significant radiographic abnormality are seen. Marked severity right-sided facet joint hypertrophy is noted at the level of L5-S1. IMPRESSION: 1. No residual renal calculi. Electronically Signed   By: Virgina Norfolk M.D.   On: 10/22/2021 16:04       Assessment & Plan:   Problem List Items Addressed This Visit     Abnormal liver function    Previous ultrasound revealed fatty liver.  Have discussed diet and exercise.  Follow liver panel.       Relevant Orders   Hepatic function panel   TSH   Lipid panel   History of colonic polyps    Last colonoscopy 2017.  Scheduled for colonoscopy later this month.       Hyperglycemia    Low carb diet and exercise.  Follow met b and a1c.       Relevant Orders   Hemoglobin A1c   Hypertension    Blood pressure remaining elevated above goal.  Increased lisinopril to 13m q day.  Continue metoprolol.  Follow pressures.  Follow metabolic panel.  Send in blood pressure readings.        Relevant Medications   lisinopril (ZESTRIL) 40 MG tablet   Other Relevant Orders   Basic metabolic panel   Sleep apnea    Recent diagnosis - sleep apnea.  CPAP.          CEinar Pheasant MD

## 2021-12-06 ENCOUNTER — Encounter: Payer: Self-pay | Admitting: Internal Medicine

## 2021-12-06 NOTE — Assessment & Plan Note (Signed)
Last colonoscopy 2017.  Scheduled for colonoscopy later this month.

## 2021-12-06 NOTE — Assessment & Plan Note (Signed)
Previous ultrasound revealed fatty liver.  Have discussed diet and exercise.  Follow liver panel.

## 2021-12-06 NOTE — Assessment & Plan Note (Signed)
Low carb diet and exercise.  Follow met b and a1c.  

## 2021-12-06 NOTE — Assessment & Plan Note (Addendum)
Blood pressure remaining elevated above goal.  Increased lisinopril to '40mg'$  q day.  Continue metoprolol.  Follow pressures.  Follow metabolic panel.  Send in blood pressure readings.

## 2021-12-06 NOTE — Assessment & Plan Note (Signed)
Recent diagnosis - sleep apnea.  CPAP.

## 2021-12-08 ENCOUNTER — Encounter: Payer: Self-pay | Admitting: Gastroenterology

## 2021-12-08 ENCOUNTER — Ambulatory Visit: Payer: BC Managed Care – PPO | Admitting: Certified Registered Nurse Anesthetist

## 2021-12-08 ENCOUNTER — Encounter
Admission: RE | Disposition: A | Discharge status: Home / Self Care | Payer: Self-pay | Source: Home / Self Care | Attending: Gastroenterology

## 2021-12-08 ENCOUNTER — Ambulatory Visit
Admission: RE | Admit: 2021-12-08 | Discharge: 2021-12-08 | Disposition: A | Discharge status: Home / Self Care | Payer: BC Managed Care – PPO | Attending: Gastroenterology | Admitting: Gastroenterology

## 2021-12-08 DIAGNOSIS — I10 Essential (primary) hypertension: Secondary | ICD-10-CM | POA: Diagnosis not present

## 2021-12-08 DIAGNOSIS — D123 Benign neoplasm of transverse colon: Secondary | ICD-10-CM | POA: Diagnosis not present

## 2021-12-08 DIAGNOSIS — Z6841 Body Mass Index (BMI) 40.0 and over, adult: Secondary | ICD-10-CM | POA: Diagnosis not present

## 2021-12-08 DIAGNOSIS — Z1211 Encounter for screening for malignant neoplasm of colon: Secondary | ICD-10-CM | POA: Diagnosis not present

## 2021-12-08 DIAGNOSIS — Z86010 Personal history of colonic polyps: Secondary | ICD-10-CM

## 2021-12-08 DIAGNOSIS — G4730 Sleep apnea, unspecified: Secondary | ICD-10-CM | POA: Diagnosis not present

## 2021-12-08 DIAGNOSIS — K635 Polyp of colon: Secondary | ICD-10-CM | POA: Diagnosis not present

## 2021-12-08 HISTORY — DX: Sleep apnea, unspecified: G47.30

## 2021-12-08 HISTORY — PX: COLONOSCOPY WITH PROPOFOL: SHX5780

## 2021-12-08 HISTORY — DX: Personal history of urinary calculi: Z87.442

## 2021-12-08 SURGERY — COLONOSCOPY WITH PROPOFOL
Anesthesia: General

## 2021-12-08 MED ORDER — SODIUM CHLORIDE 0.9 % IV SOLN: INTRAVENOUS | Status: DC

## 2021-12-08 MED ORDER — PROPOFOL 10 MG/ML IV BOLUS
INTRAVENOUS | Status: DC | PRN
  Administered 2021-12-08: 70 mg via INTRAVENOUS

## 2021-12-08 MED ORDER — PROPOFOL 500 MG/50ML IV EMUL
INTRAVENOUS | Status: DC | PRN
  Administered 2021-12-08: 140 ug/kg/min via INTRAVENOUS

## 2021-12-08 MED ORDER — LIDOCAINE HCL (CARDIAC) PF 100 MG/5ML IV SOSY
PREFILLED_SYRINGE | INTRAVENOUS | Status: DC | PRN
  Administered 2021-12-08: 50 mg via INTRAVENOUS

## 2021-12-08 MED ORDER — PROPOFOL 1000 MG/100ML IV EMUL
INTRAVENOUS | Status: AC
  Filled 2021-12-08: qty 200

## 2021-12-08 NOTE — Transfer of Care (Signed)
Immediate Anesthesia Transfer of Care Note  Patient: Derek Griffith  Procedure(s) Performed: COLONOSCOPY WITH PROPOFOL  Patient Location: PACU  Anesthesia Type:General  Level of Consciousness: awake, alert  and oriented  Airway & Oxygen Therapy: Patient Spontanous Breathing  Post-op Assessment: Report given to RN and Post -op Vital signs reviewed and stable  Post vital signs: Reviewed and stable  Last Vitals:  Vitals Value Taken Time  BP 124/81 12/08/21 0805  Temp    Pulse 68 12/08/21 0805  Resp 21 12/08/21 0805  SpO2 97 % 12/08/21 0805  Vitals shown include unvalidated device data.  Last Pain:  Vitals:   12/08/21 0702  TempSrc: Temporal         Complications: No notable events documented.

## 2021-12-08 NOTE — Op Note (Signed)
[facility] Gastroenterology Patient Name: Derek Griffith Procedure Date: 12/08/2021 7:21 AM MRN: [id number] Account #: [id number] Date of Birth: 1966/03/11 Admit Type: Outpatient Age: 56 Room: [facility] ENDO ROOM 3 Gender: Male Note Status: Finalized Instrument Name: Colonoscope [id number] Procedure:             Colonoscopy Indications:           Screening for colorectal malignant neoplasm, Last                         colonoscopy: March 2017 Providers:             Lin Landsman MD, MD Referring MD:          Einar Pheasant, MD (Referring MD) Medicines:             General Anesthesia Complications:         No immediate complications. Estimated blood loss: None. Procedure:             Pre-Anesthesia Assessment:                        - Prior to the procedure, a History and Physical was                         performed, and patient medications and allergies were                         reviewed. The patient is competent. The risks and                         benefits of the procedure and the sedation options and                         risks were discussed with the patient. All questions                         were answered and informed consent was obtained.                         Patient identification and proposed procedure were                         verified by the physician, the nurse, the                         anesthesiologist, the anesthetist and the technician                         in the pre-procedure area in the procedure room in the                         endoscopy suite. Mental Status Examination: alert and                         oriented. Airway Examination: normal oropharyngeal                         airway and neck mobility. Respiratory Examination:  clear to auscultation. CV Examination: normal.                         Prophylactic Antibiotics: The patient does not require                         prophylactic  antibiotics. Prior Anticoagulants: The                         patient has taken no previous anticoagulant or                         antiplatelet agents. ASA Grade Assessment: II - A                         patient with mild systemic disease. After reviewing                         the risks and benefits, the patient was deemed in                         satisfactory condition to undergo the procedure. The                         anesthesia plan was to use general anesthesia.                         Immediately prior to administration of medications,                         the patient was re-assessed for adequacy to receive                         sedatives. The heart rate, respiratory rate, oxygen                         saturations, blood pressure, adequacy of pulmonary                         ventilation, and response to care were monitored                         throughout the procedure. The physical status of the                         patient was re-assessed after the procedure.                        After obtaining informed consent, the colonoscope was                         passed under direct vision. Throughout the procedure,                         the patient's blood pressure, pulse, and oxygen                         saturations were monitored continuously. The  Colonoscope was introduced through the anus and                         advanced to the the cecum, identified by appendiceal                         orifice and ileocecal valve. The colonoscopy was                         performed without difficulty. The patient tolerated                         the procedure well. The quality of the bowel                         preparation was evaluated using the BBPS [facility] Bowel                         Preparation Scale) with scores of: Right Colon = 3,                         Transverse Colon = 3 and Left Colon = 3 (entire mucosa                          seen well with no residual staining, small fragments                         of stool or opaque liquid). The total BBPS score                         equals 9. Findings:      The perianal and digital rectal examinations were normal. Pertinent       negatives include normal sphincter tone and no palpable rectal lesions.      A 6 mm polyp was found in the proximal transverse colon. The polyp was       sessile. The polyp was removed with a cold snare. Resection and       retrieval were complete.      The retroflexed view of the distal rectum and anal verge was normal and       showed no anal or rectal abnormalities.      The exam was otherwise without abnormality. Impression:            - One 6 mm polyp in the proximal transverse colon,                         removed with a cold snare. Resected and retrieved.                        - The distal rectum and anal verge are normal on                         retroflexion view.                        - The examination was otherwise normal. Recommendation:        - Discharge patient to home (with escort).                        -  Resume previous diet today.                        - Continue present medications.                        - Await pathology results.                        - Repeat colonoscopy in 5 to 7 years for surveillance                         based on pathology results. Procedure Code(s):     --- Professional ---                        [phone], Colonoscopy, flexible; with removal of                         tumor(s), polyp(s), or other lesion(s) by snare                         technique Diagnosis Code(s):     --- Professional ---                        Z12.11, Encounter for screening for malignant neoplasm                         of colon                        K63.5, Polyp of colon CPT copyright 2019 American Medical Association. All rights reserved. The codes documented in this report are preliminary and upon coder review may   be revised to meet current compliance requirements. Dr. Ulyess Mort Lin Landsman MD, MD 12/08/2021 8:04:04 AM This report has been signed electronically. Number of Addenda: 0 Note Initiated On: 12/08/2021 7:21 AM Scope Withdrawal Time: 0 hours 11 minutes 44 seconds  Total Procedure Duration: 0 hours 15 minutes 46 seconds  Estimated Blood Loss:  Estimated blood loss: none.      [facility]

## 2021-12-08 NOTE — Anesthesia Procedure Notes (Signed)
Date/Time: 12/08/2021 7:45 AM  Performed by: Lily Peer, Flecia Shutter, CRNAPre-anesthesia Checklist: Patient identified, Emergency Drugs available, Suction available, Patient being monitored and Timeout performed Patient Re-evaluated:Patient Re-evaluated prior to induction Oxygen Delivery Method: Nasal cannula Induction Type: IV induction

## 2021-12-08 NOTE — Anesthesia Preprocedure Evaluation (Signed)
Anesthesia Evaluation  Patient identified by MRN, date of birth, ID band Patient awake    Reviewed: Allergy & Precautions, NPO status , Patient's Chart, lab work & pertinent test results  History of Anesthesia Complications Negative for: history of anesthetic complications  Airway Mallampati: I  TM Distance: >3 FB Neck ROM: Full    Dental no notable dental hx.    Pulmonary sleep apnea and Continuous Positive Airway Pressure Ventilation ,    Pulmonary exam normal breath sounds clear to auscultation       Cardiovascular Exercise Tolerance: Good hypertension, Pt. on medications and Pt. on home beta blockers Normal cardiovascular exam Rhythm:Regular Rate:Normal     Neuro/Psych negative neurological ROS  negative psych ROS   GI/Hepatic negative GI ROS, Neg liver ROS,   Endo/Other  negative endocrine ROS  Renal/GU negative Renal ROS  negative genitourinary   Musculoskeletal negative musculoskeletal ROS (+)   Abdominal   Peds negative pediatric ROS (+)  Hematology negative hematology ROS (+)   Anesthesia Other Findings   Reproductive/Obstetrics negative OB ROS                           Anesthesia Physical Anesthesia Plan  ASA: 2  Anesthesia Plan: General   Post-op Pain Management: Minimal or no pain anticipated   Induction: Intravenous  PONV Risk Score and Plan: 1 and Propofol infusion and TIVA  Airway Management Planned: Natural Airway and Nasal Cannula  Additional Equipment:   Intra-op Plan:   Post-operative Plan:   Informed Consent: I have reviewed the patients History and Physical, chart, labs and discussed the procedure including the risks, benefits and alternatives for the proposed anesthesia with the patient or authorized representative who has indicated his/her understanding and acceptance.     Dental Advisory Given  Plan Discussed with: Anesthesiologist, CRNA and  Surgeon  Anesthesia Plan Comments: (Patient consented for risks of anesthesia including but not limited to:  - adverse reactions to medications - risk of airway placement if required - damage to eyes, teeth, lips or other oral mucosa - nerve damage due to positioning  - sore throat or hoarseness - Damage to heart, brain, nerves, lungs, other parts of body or loss of life  Patient voiced understanding.)        Anesthesia Quick Evaluation

## 2021-12-08 NOTE — H&P (Signed)
Derek Darby, MD [address]  [place]  [place], [place] [address]  Main: [phone]  Fax: [phone] Pager: [phone]  Primary Care Physician:  Einar Pheasant, MD Primary Gastroenterologist:  Dr. Cephas Griffith  Pre-Procedure History & Physical: HPI:  Derek Griffith is a 56 y.o. male is here for an colonoscopy.   Past Medical History:  Diagnosis Date   Environmental allergies    History of kidney stones    Hypertension    Nephrolithiasis    Sleep apnea     Past Surgical History:  Procedure Laterality Date   COLONOSCOPY WITH PROPOFOL N/A 06/16/2015   Procedure: COLONOSCOPY WITH PROPOFOL;  Surgeon: Manya Silvas, MD;  Location: [facility] [place];  Service: Endoscopy;  Laterality: N/A;   EXTRACORPOREAL SHOCK WAVE LITHOTRIPSY Right 10/08/2021   Procedure: EXTRACORPOREAL SHOCK WAVE LITHOTRIPSY (ESWL);  Surgeon: Hollice Espy, MD;  Location: [facility] ORS;  Service: Urology;  Laterality: Right;    Prior to Admission medications   Medication Sig Start Date End Date Taking? Authorizing Provider  fish oil-omega-3 fatty acids 1000 MG capsule Take 1 g by mouth daily.   Yes [provider]  lisinopril (ZESTRIL) 40 MG tablet Take 1 tablet (40 mg total) by mouth daily. 11/25/21  Yes Einar Pheasant, MD  metoprolol tartrate (LOPRESSOR) 25 MG tablet Take 1 tablet (25 mg total) by mouth 2 (two) times daily. 09/28/21  Yes Einar Pheasant, MD  Multiple Vitamin (MULTIVITAMIN) tablet Take 1 tablet by mouth daily.   Yes [provider]    Allergies as of 11/11/2021 - Review Complete 10/22/2021  Allergen Reaction Noted   No known drug allergy  02/29/2012    Family History  Problem Relation Age of Onset   Hypertension Father    Hypertension Mother    Diabetes Mother    Down syndrome Sister    Prostate cancer Neg Hx    Colon cancer Neg Hx     Social History   Socioeconomic History   Marital status: Married    Spouse name: Not on file   Number of  children: 2   Years of education: Not on file   Highest education level: Not on file  Occupational History   Not on file  Tobacco Use   Smoking status: Never    Passive exposure: Never   Smokeless tobacco: Never  Vaping Use   Vaping Use: Never used  Substance and Sexual Activity   Alcohol use: No    Alcohol/week: 0.0 standard drinks of alcohol   Drug use: No   Sexual activity: Not on file  Other Topics Concern   Not on file  Social History Narrative   Not on file   Social Determinants of Health   Financial Resource Strain: Not on file  Food Insecurity: Not on file  Transportation Needs: Not on file  Physical Activity: Not on file  Stress: Not on file  Social Connections: Not on file  Intimate Partner Violence: Not on file    Review of Systems: See HPI, otherwise negative ROS  Physical Exam: BP (!) 150/94   Pulse 61   Temp (!) 96.4 F (35.8 C) (Temporal)   Resp 18   Ht '5\' 11"'$  (1.803 m)   Wt 131.4 kg   SpO2 98%   BMI 40.39 kg/m  General:   Alert,  pleasant and cooperative in NAD Head:  Normocephalic and atraumatic. Neck:  Supple; no masses or thyromegaly. Lungs:  Clear throughout to auscultation.    Heart:  Regular rate and rhythm. Abdomen:  Soft, nontender and nondistended. Normal bowel sounds, without guarding, and without rebound.   Neurologic:  Alert and  oriented x4;  grossly normal neurologically.  Impression/Plan: Derek Griffith is here for an colonoscopy to be performed for colon cancer screening  Risks, benefits, limitations, and alternatives regarding  colonoscopy have been reviewed with the patient.  Questions have been answered.  All parties agreeable.   Sherri Sear, MD  12/08/2021, 7:29 AM

## 2021-12-08 NOTE — Anesthesia Postprocedure Evaluation (Signed)
Anesthesia Post Note  Patient: Derek Griffith  Procedure(s) Performed: COLONOSCOPY WITH PROPOFOL  Patient location during evaluation: Endoscopy Anesthesia Type: General Level of consciousness: awake and alert Pain management: pain level controlled Vital Signs Assessment: post-procedure vital signs reviewed and stable Respiratory status: spontaneous breathing, nonlabored ventilation, respiratory function stable and patient connected to nasal cannula oxygen Cardiovascular status: blood pressure returned to baseline and stable Postop Assessment: no apparent nausea or vomiting Anesthetic complications: no   No notable events documented.   Last Vitals:  Vitals:   12/08/21 0806 12/08/21 0825  BP:  125/86  Pulse:    Resp:    Temp: (!) 35.8 C   SpO2:      Last Pain:  Vitals:   12/08/21 0825  TempSrc:   PainSc: 0-No pain                 Ilene Qua

## 2021-12-09 ENCOUNTER — Encounter: Payer: Self-pay | Admitting: Gastroenterology

## 2021-12-09 LAB — SURGICAL PATHOLOGY

## 2021-12-10 ENCOUNTER — Encounter: Payer: Self-pay | Admitting: Gastroenterology

## 2021-12-18 ENCOUNTER — Telehealth (INDEPENDENT_AMBULATORY_CARE_PROVIDER_SITE_OTHER): Payer: BC Managed Care – PPO | Admitting: Adult Health

## 2021-12-18 ENCOUNTER — Encounter: Payer: Self-pay | Admitting: Adult Health

## 2021-12-18 VITALS — Ht 71.0 in | Wt 290.0 lb

## 2021-12-18 DIAGNOSIS — G4733 Obstructive sleep apnea (adult) (pediatric): Secondary | ICD-10-CM

## 2021-12-18 NOTE — Progress Notes (Signed)
Virtual Visit via Video Note  I connected with Derek Griffith on 12/18/21 at  2:00 PM EDT by a video enabled telemedicine application and verified that I am speaking with the correct person using two identifiers.  Location: Patient: Home  Provider: Office    I discussed the limitations of evaluation and management by telemedicine and the availability of in person appointments. The patient expressed understanding and agreed to proceed.  History of Present Illness: 56 year old male seen for sleep consult May 05, 2021 for snoring and daytime sleepiness found to have moderate sleep apnea on home sleep study.  Today's video visit is a 36-monthfollow-up for sleep apnea.  Patient was seen earlier this year for sleep consult.  He had snoring and daytime sleepiness.  Set up for home sleep study that was completed on August 28, 2021 that showed moderate sleep apnea.  Patient was started on CPAP last visit.  Patient says he got his CPAP machine about 6 weeks ago.  Says he is doing much better since starting CPAP.  He feels more rested and feels that he benefits from CPAP.  Typically wears it about 6 to 7 hours each night.  Deftly has more energy first in the morning.  CPAP download shows 100% compliance.  Daily average usage at 6.5 hours.  Patient is on auto CPAP 5 to 15 cm H2O.  AHI is 1.6/hour.  Daily average pressure at 9.1 cm H2O.  The patient is using nasal pillows.  Patient says he is doing very well.  Past Medical History:  Diagnosis Date   Environmental allergies    History of kidney stones    Hypertension    Nephrolithiasis    Sleep apnea    Current Outpatient Medications on File Prior to Visit  Medication Sig Dispense Refill   fish oil-omega-3 fatty acids 1000 MG capsule Take 1 g by mouth daily.     lisinopril (ZESTRIL) 40 MG tablet Take 1 tablet (40 mg total) by mouth daily. 90 tablet 1   metoprolol tartrate (LOPRESSOR) 25 MG tablet Take 1 tablet (25 mg total) by mouth 2 (two) times  daily. 180 tablet 1   Multiple Vitamin (MULTIVITAMIN) tablet Take 1 tablet by mouth daily.     No current facility-administered medications on file prior to visit.      Observations/Objective: Home sleep study August 28, 2021 showed AHI at 24/hour and SPO2 low at 87%.  Assessment and Plan: Moderate obstructive sleep apnea with excellent control compliance on CPAP.  Patient education was given. Patient has perceived clinical benefit. We will continue on CPAP at bedtime.  Patient education was given.  - discussed how weight can impact sleep and risk for sleep disordered breathing - discussed options to assist with weight loss: combination of diet modification, cardiovascular and strength training exercises   - had an extensive discussion regarding the adverse health consequences related to untreated sleep disordered breathing - specifically discussed the risks for hypertension, coronary artery disease, cardiac dysrhythmias, cerebrovascular disease, and diabetes - lifestyle modification discussed   - discussed how sleep disruption can increase risk of accidents, particularly when driving - safe driving practices were discussed  Obesity- healthy weight loss    Plan  Patient Instructions  Continue CPAP at bedtime, wear all night long.   Keep up the good work Healthy sleep regimen as discussed Do not drive if sleepy Work on healthy weight loss Follow-up in 6 months and as needed-virtual or [employer]  Follow Up Instructions:    I discussed the assessment and treatment plan with the patient. The patient was provided an opportunity to ask questions and all were answered. The patient agreed with the plan and demonstrated an understanding of the instructions.   The patient was advised to call back or seek an in-person evaluation if the symptoms worsen or if the condition fails to improve as anticipated.  I provided 20 minutes of non-face-to-face time during this  encounter.   Rexene Edison, NP

## 2021-12-18 NOTE — Patient Instructions (Addendum)
Continue CPAP at bedtime, wear all night long.   Keep up the good work Healthy sleep regimen as discussed Do not drive if sleepy Work on healthy weight loss Follow-up in 6 months and as needed-virtual or [employer] office

## 2021-12-19 DIAGNOSIS — G4733 Obstructive sleep apnea (adult) (pediatric): Secondary | ICD-10-CM | POA: Diagnosis not present

## 2021-12-21 NOTE — Progress Notes (Signed)
Reviewed and agree with assessment/plan.   Chesley Mires, MD [facility] Pulmonary/Critical Care 12/21/2021, 8:44 AM Pager:  [phone]

## 2021-12-25 DIAGNOSIS — G4733 Obstructive sleep apnea (adult) (pediatric): Secondary | ICD-10-CM | POA: Diagnosis not present

## 2022-01-19 DIAGNOSIS — G4733 Obstructive sleep apnea (adult) (pediatric): Secondary | ICD-10-CM | POA: Diagnosis not present

## 2022-01-25 DIAGNOSIS — G4733 Obstructive sleep apnea (adult) (pediatric): Secondary | ICD-10-CM | POA: Diagnosis not present

## 2022-02-24 ENCOUNTER — Telehealth: Payer: Self-pay

## 2022-02-24 MED ORDER — COLCHICINE 0.6 MG PO TABS: 0.6000 mg | ORAL_TABLET | Freq: Two times a day (BID) | ORAL | 0 refills | Status: DC

## 2022-02-24 NOTE — Telephone Encounter (Signed)
Spoke with pt and informed him of the medication  that has been sent in for the gout flare and explained the directions of use. Pt gave a verbal understanding.

## 2022-02-24 NOTE — Telephone Encounter (Signed)
Notify Derek Griffith  - sugary drinks and sweets also high fructose - can trigger gout flares.  Regarding medication, allopurinol is for maintenance.  If he is having a flare now, would recommend colchicine .'6mg'$  bid #30 with no refills.  Call if persistent problems.

## 2022-03-30 ENCOUNTER — Other Ambulatory Visit (INDEPENDENT_AMBULATORY_CARE_PROVIDER_SITE_OTHER): Payer: BC Managed Care – PPO

## 2022-03-30 DIAGNOSIS — I10 Essential (primary) hypertension: Secondary | ICD-10-CM | POA: Diagnosis not present

## 2022-03-30 DIAGNOSIS — R739 Hyperglycemia, unspecified: Secondary | ICD-10-CM

## 2022-03-30 DIAGNOSIS — R945 Abnormal results of liver function studies: Secondary | ICD-10-CM | POA: Diagnosis not present

## 2022-03-30 DIAGNOSIS — M545 Low back pain, unspecified: Secondary | ICD-10-CM

## 2022-03-30 DIAGNOSIS — G4733 Obstructive sleep apnea (adult) (pediatric): Secondary | ICD-10-CM | POA: Diagnosis not present

## 2022-03-30 LAB — LIPID PANEL
Cholesterol: 134 mg/dL (ref 0–200)
HDL: 43.7 mg/dL (ref >39.00)
LDL Cholesterol: 55 mg/dL (ref 0–99)
NonHDL: 90.66
Total CHOL/HDL Ratio: 3
Triglycerides: 178 mg/dL — ABNORMAL HIGH (ref 0.0–149.0)
VLDL: 35.6 mg/dL (ref 0.0–40.0)

## 2022-03-30 LAB — HEPATIC FUNCTION PANEL
ALT: 58 U/L — ABNORMAL HIGH (ref 0–53)
AST: 34 U/L (ref 0–37)
Albumin: 4.3 g/dL (ref 3.5–5.2)
Alkaline Phosphatase: 44 U/L (ref 39–117)
Bilirubin, Direct: 0.1 mg/dL (ref 0.0–0.3)
Total Bilirubin: 0.6 mg/dL (ref 0.2–1.2)
Total Protein: 7.2 g/dL (ref 6.0–8.3)

## 2022-03-30 LAB — BASIC METABOLIC PANEL
BUN: 12 mg/dL (ref 6–23)
CO2: 28 mEq/L (ref 19–32)
Calcium: 9.3 mg/dL (ref 8.4–10.5)
Chloride: 103 mEq/L (ref 96–112)
Creatinine, Ser: 0.96 mg/dL (ref 0.40–1.50)
GFR: 87.37 mL/min (ref >60.00)
Glucose, Bld: 114 mg/dL — ABNORMAL HIGH (ref 70–99)
Potassium: 4.3 mEq/L (ref 3.5–5.1)
Sodium: 141 mEq/L (ref 135–145)

## 2022-03-30 LAB — TSH: TSH: 1.27 u[IU]/mL (ref 0.35–5.50)

## 2022-03-30 LAB — HEMOGLOBIN A1C: Hgb A1c MFr Bld: 6.3 % (ref 4.6–6.5)

## 2022-04-01 ENCOUNTER — Ambulatory Visit (INDEPENDENT_AMBULATORY_CARE_PROVIDER_SITE_OTHER): Payer: BC Managed Care – PPO | Admitting: Internal Medicine

## 2022-04-01 ENCOUNTER — Encounter: Payer: Self-pay | Admitting: Internal Medicine

## 2022-04-01 VITALS — BP 132/90 | HR 63 | Temp 98.0°F | Resp 14 | Ht 72.0 in | Wt 293.0 lb

## 2022-04-01 DIAGNOSIS — I10 Essential (primary) hypertension: Secondary | ICD-10-CM

## 2022-04-01 DIAGNOSIS — Z0000 Encounter for general adult medical examination without abnormal findings: Secondary | ICD-10-CM | POA: Diagnosis not present

## 2022-04-01 DIAGNOSIS — R739 Hyperglycemia, unspecified: Secondary | ICD-10-CM

## 2022-04-01 DIAGNOSIS — G4730 Sleep apnea, unspecified: Secondary | ICD-10-CM

## 2022-04-01 DIAGNOSIS — Z86010 Personal history of colonic polyps: Secondary | ICD-10-CM

## 2022-04-01 DIAGNOSIS — Z13220 Encounter for screening for lipoid disorders: Secondary | ICD-10-CM

## 2022-04-01 DIAGNOSIS — R945 Abnormal results of liver function studies: Secondary | ICD-10-CM

## 2022-04-01 MED ORDER — VALSARTAN 160 MG PO TABS: 160.0000 mg | ORAL_TABLET | Freq: Every day | ORAL | 3 refills | Status: DC

## 2022-04-01 NOTE — Addendum Note (Signed)
Addended by: Neta Ehlers on: 04/01/2022 03:51 PM   Modules accepted: Orders

## 2022-04-01 NOTE — Progress Notes (Signed)
Subjective:    [name], male    DOB: 05-02-1965, 57 y.o.   MRN: [id number]  Patient here for  Chief Complaint  Patient presents with   Annual Exam    CPE    HPI Here for physical exam. Recently evaluated by urology for kidney stone. Underwent ESWL with Dr. Erlene Quan on 10/08/2021 for management of a 6.5 mm distal right ureteral stone. He passed multiple fragments between 4 and 7 days following his procedure. His pain has resolved. Saw pulmonary 12/18/21 - f/u sleep apnea.  Using cpap. Compliant.  He has not been watching his diet as well recently.  Eating more carbs.  Discussed labs.  Triglycerides and A1c increased from the last check.  No chest pain, tightness or shortness of breath.  No nausea or vomiting.  No abdominal pain.  Bowels stable. Increased stress. Stress related to his father's medical issues.  Overall he feels he is doing relatively well.  Blood pressures outside checks:  133/77 and 132/72.    Past Medical History:  Diagnosis Date   Environmental allergies    History of kidney stones    Hypertension    Nephrolithiasis    Sleep apnea    Past Surgical History:  Procedure Laterality Date   COLONOSCOPY WITH PROPOFOL N/A 06/16/2015   Procedure: COLONOSCOPY WITH PROPOFOL;  Surgeon: Manya Silvas, MD;  Location: [place];  Service: Endoscopy;  Laterality: N/A;   COLONOSCOPY WITH PROPOFOL N/A 12/08/2021   Procedure: COLONOSCOPY WITH PROPOFOL;  Surgeon: Lin Landsman, MD;  Location: [facility] ENDOSCOPY;  Service: Gastroenterology;  Laterality: N/A;   EXTRACORPOREAL SHOCK WAVE LITHOTRIPSY Right 10/08/2021   Procedure: EXTRACORPOREAL SHOCK WAVE LITHOTRIPSY (ESWL);  Surgeon: Hollice Espy, MD;  Location: [facility];  Service: Urology;  Laterality: Right;   Family History  Problem Relation Age of Onset   Hypertension Father    Hypertension Mother    Diabetes Mother    Down syndrome Sister    Prostate cancer Neg Hx    Colon cancer Neg Hx    Social  History   Socioeconomic History   Marital status: Married    Spouse name: Not on file   Number of children: 2   Years of education: Not on file   Highest education level: Not on file  Occupational History   Not on file  Tobacco Use   Smoking status: Never    Passive exposure: Never   Smokeless tobacco: Never  Vaping Use   Vaping Use: Never used  Substance and Sexual Activity   Alcohol use: No    Alcohol/week: 0.0 standard drinks of alcohol   Drug use: No   Sexual activity: Not on file  Other Topics Concern   Not on file  Social History Narrative   Not on file   Social Determinants of Health   Financial Resource Strain: Not on file  Food Insecurity: Not on file  Transportation Needs: Not on file  Physical Activity: Not on file  Stress: Not on file  Social Connections: Not on file     Review of Systems  Constitutional:  Negative for appetite change and unexpected weight change.  HENT:  Negative for congestion, sinus pressure and sore throat.   Eyes:  Negative for pain and visual disturbance.  Respiratory:  Negative for cough, chest tightness and shortness of breath.   Cardiovascular:  Negative for chest pain, palpitations and leg swelling.  Gastrointestinal:  Negative for abdominal pain, diarrhea, nausea and vomiting.  Genitourinary:  Negative for difficulty urinating and dysuria.  Musculoskeletal:  Negative for joint swelling and myalgias.  Skin:  Negative for color change and rash.  Neurological:  Negative for dizziness and headaches.  Hematological:  Negative for adenopathy. Does not bruise/bleed easily.  Psychiatric/Behavioral:  Negative for agitation and dysphoric mood.        Objective:     BP (!) 132/90   Pulse 63   Temp 98 F (36.7 C) (Temporal)   Resp 14   Ht 6' (1.829 m)   Wt 293 lb (132.9 kg)   SpO2 96%   BMI 39.74 kg/m  Wt Readings from Last 3 Encounters:  04/01/22 293 lb (132.9 kg)  12/18/21 290 lb (131.5 kg)  12/08/21 289 lb 9.5 oz  (131.4 kg)    Physical Exam Constitutional:      General: He is not in acute distress.    Appearance: Normal appearance. He is well-developed.  HENT:     Head: Normocephalic and atraumatic.     Right Ear: External ear normal.     Left Ear: External ear normal.  Eyes:     General: No scleral icterus.       Right eye: No discharge.        Left eye: No discharge.     Conjunctiva/sclera: Conjunctivae normal.  Neck:     Thyroid: No thyromegaly.  Cardiovascular:     Rate and Rhythm: Normal rate and regular rhythm.  Pulmonary:     Effort: No respiratory distress.     Breath sounds: Normal breath sounds. No wheezing.  Abdominal:     General: Bowel sounds are normal.     Palpations: Abdomen is soft.     Tenderness: There is no abdominal tenderness.  Musculoskeletal:        General: No swelling or tenderness.     Cervical back: Neck supple. No tenderness.  Lymphadenopathy:     Cervical: No cervical adenopathy.  Skin:    Findings: No erythema or rash.  Neurological:     Mental Status: He is alert and oriented to person, place, and time.  Psychiatric:        Mood and Affect: Mood normal.        Behavior: Behavior normal.      Outpatient Encounter Medications as of 04/01/2022  Medication Sig   fish oil-omega-3 fatty acids 1000 MG capsule Take 1 g by mouth daily.   metoprolol tartrate (LOPRESSOR) 25 MG tablet Take 1 tablet (25 mg total) by mouth 2 (two) times daily.   Multiple Vitamin (MULTIVITAMIN) tablet Take 1 tablet by mouth daily.   valsartan (DIOVAN) 160 MG tablet Take 1 tablet (160 mg total) by mouth daily.   [DISCONTINUED] lisinopril (ZESTRIL) 40 MG tablet Take 1 tablet (40 mg total) by mouth daily.   [DISCONTINUED] colchicine 0.6 MG tablet Take 1 tablet (0.6 mg total) by mouth 2 (two) times daily.   No facility-administered encounter medications on file as of 04/01/2022.     Lab Results  Component Value Date   WBC 5.7 09/28/2021   HGB 14.9 09/28/2021   HCT 44.0  09/28/2021   PLT 223.0 09/28/2021   GLUCOSE 114 (H) 03/30/2022   CHOL 134 03/30/2022   TRIG 178.0 (H) 03/30/2022   HDL 43.70 03/30/2022   LDLCALC 55 03/30/2022   ALT 58 (H) 03/30/2022   AST 34 03/30/2022   NA 141 03/30/2022   K 4.3 03/30/2022   CL 103 03/30/2022   CREATININE 0.96 03/30/2022   BUN  12 03/30/2022   CO2 28 03/30/2022   TSH 1.27 03/30/2022   PSA 0.13 04/15/2021   HGBA1C 6.3 03/30/2022    No results found.     Assessment & Plan:  Routine general medical examination at a health care facility  History of colonic polyps Assessment & Plan: Colonoscopy 12/08/21 - 61m polyp transverse colon. Pathology -  COLON POLYP, TRANSVERSE; COLD SNARE: SESSILE SERRATED POLYP.  NEGATIVE FOR DYSPLASIA AND MALIGNANCY. Repeat colonoscopy in 5 years.   Healthcare maintenance Assessment & Plan: Physical today 04/01/22.  PSA - 04/15/21 - .13.   Colonoscopy 12/08/21.  Recommended f/u in 5 years.      Primary hypertension Assessment & Plan: Blood pressure remaining elevated above goal.  Increased lisinopril to '40mg'$  q day previously. Continues on metoprolol.  Will try changing lisinopril to diovan '160mg'$  q day.  Increase to '320mg'$  if blood pressure still elevated.  Follow pressures.  Follow metabolic panel.  Send in blood pressure readings.    Orders: -     Basic metabolic panel; Future -     CBC with Differential/Platelet; Future  Hyperglycemia Assessment & Plan: Low carb diet and exercise.  Follow met b and a1c.  Lab Results  Component Value Date   HGBA1C 6.3 03/30/2022      Orders: -     Hemoglobin A1c; Future  Screening cholesterol level -     Lipid panel; Future -     Hepatic function panel; Future  Abnormal liver function Assessment & Plan: Previous ultrasound revealed fatty liver.  Recent labs - ALT 58.  Have discussed diet and exercise.  Follow liver panel. Discussed f/u ultrasound. Wants to hold.     Sleep apnea, unspecified type Assessment & Plan: Recent  diagnosis - sleep apnea. Continues cpap.  Using regularly.    Other orders -     Valsartan; Take 1 tablet (160 mg total) by mouth daily.  Dispense: 30 tablet; Refill: 3     CEinar Pheasant MD

## 2022-04-01 NOTE — Assessment & Plan Note (Signed)
Physical today 04/01/22.  PSA - 04/15/21 - .13.   Colonoscopy 12/08/21.  Recommended f/u in 5 years.

## 2022-04-04 ENCOUNTER — Encounter: Payer: Self-pay | Admitting: Internal Medicine

## 2022-04-04 NOTE — Assessment & Plan Note (Signed)
Recent diagnosis - sleep apnea. Continues cpap.  Using regularly.

## 2022-04-04 NOTE — Assessment & Plan Note (Signed)
Blood pressure remaining elevated above goal.  Increased lisinopril to '40mg'$  q day previously. Continues on metoprolol.  Will try changing lisinopril to diovan '160mg'$  q day.  Increase to '320mg'$  if blood pressure still elevated.  Follow pressures.  Follow metabolic panel.  Send in blood pressure readings.

## 2022-04-04 NOTE — Assessment & Plan Note (Signed)
Colonoscopy 12/08/21 - 109m polyp transverse colon. Pathology -  COLON POLYP, TRANSVERSE; COLD SNARE: SESSILE SERRATED POLYP.  NEGATIVE FOR DYSPLASIA AND MALIGNANCY. Repeat colonoscopy in 5 years.

## 2022-04-04 NOTE — Assessment & Plan Note (Signed)
Previous ultrasound revealed fatty liver.  Recent labs - ALT 58.  Have discussed diet and exercise.  Follow liver panel. Discussed f/u ultrasound. Wants to hold.

## 2022-04-04 NOTE — Assessment & Plan Note (Signed)
Low carb diet and exercise.  Follow met b and a1c.  Lab Results  Component Value Date   HGBA1C 6.3 03/30/2022

## 2022-04-30 DIAGNOSIS — G4733 Obstructive sleep apnea (adult) (pediatric): Secondary | ICD-10-CM | POA: Diagnosis not present

## 2022-05-29 DIAGNOSIS — G4733 Obstructive sleep apnea (adult) (pediatric): Secondary | ICD-10-CM | POA: Diagnosis not present

## 2022-06-03 ENCOUNTER — Encounter: Payer: Self-pay | Admitting: Internal Medicine

## 2022-06-03 ENCOUNTER — Ambulatory Visit (INDEPENDENT_AMBULATORY_CARE_PROVIDER_SITE_OTHER): Payer: BC Managed Care – PPO | Admitting: Internal Medicine

## 2022-06-03 VITALS — BP 136/86 | HR 72 | Temp 97.4°F | Ht 71.0 in | Wt 299.4 lb

## 2022-06-03 DIAGNOSIS — Z86010 Personal history of colonic polyps: Secondary | ICD-10-CM | POA: Diagnosis not present

## 2022-06-03 DIAGNOSIS — G4730 Sleep apnea, unspecified: Secondary | ICD-10-CM

## 2022-06-03 DIAGNOSIS — R945 Abnormal results of liver function studies: Secondary | ICD-10-CM | POA: Diagnosis not present

## 2022-06-03 DIAGNOSIS — R739 Hyperglycemia, unspecified: Secondary | ICD-10-CM | POA: Diagnosis not present

## 2022-06-03 DIAGNOSIS — I10 Essential (primary) hypertension: Secondary | ICD-10-CM

## 2022-06-03 MED ORDER — VALSARTAN 320 MG PO TABS: 320.0000 mg | ORAL_TABLET | Freq: Every day | ORAL | 1 refills | Status: DC

## 2022-06-03 NOTE — Progress Notes (Signed)
Subjective:    [name], male    DOB: [birth year], 57 y.o.   MRN: [id number]  Patient here for  Chief Complaint  Patient presents with   Medical Management of Chronic Issues    HPI Here to follow up regarding his blood pressure. Was changed to diovan last visit.  Blood pressures remaining in the [id number] systolic range.  Tolerating.  No chest pain or sob reported.  No cough or congestion.  No abdominal pain or bowel change reported.  Using cpap regularly.  Tolerating.  Handling stress.    Past Medical History:  Diagnosis Date   Environmental allergies    History of kidney stones    Hypertension    Nephrolithiasis    Sleep apnea    Past Surgical History:  Procedure Laterality Date   COLONOSCOPY WITH PROPOFOL N/A 06/16/2015   Procedure: COLONOSCOPY WITH PROPOFOL;  Surgeon: Manya Silvas, MD;  Location: [place];  Service: Endoscopy;  Laterality: N/A;   COLONOSCOPY WITH PROPOFOL N/A 12/08/2021   Procedure: COLONOSCOPY WITH PROPOFOL;  Surgeon: Lin Landsman, MD;  Location: [facility] END[facility]COPY;  Service: Gastroenterology;  Laterality: N/A;   EXTRACORPOREAL SHOCK WAVE LITHOTRIPSY Right 10/08/2021   Procedure: EXTRACORPOREAL SHOCK WAVE LITHOTRIPSY (ESWL);  Surgeon: Hollice Espy, MD;  Location: [facility];  Service: Urology;  Laterality: Right;   Family History  Problem Relation Age of Onset   Hypertension Father    Hypertension Mother    Diabetes Mother    Down syndrome Sister    Prostate cancer Neg Hx    Colon cancer Neg Hx    Social History   Socioeconomic History   Marital status: Married    Spouse name: Not on file   Number of children: 2   Years of education: Not on file   Highest education level: Not on file  Occupational History   Not on file  Tobacco Use   Smoking status: Never    Passive exposure: Never   Smokeless tobacco: Never  Vaping Use   Vaping Use: Never used  Substance and Sexual Activity   Alcohol use: No    Alcohol/week:  0.0 standard drinks of alcohol   Drug use: No   Sexual activity: Not on file  Other Topics Concern   Not on file  Social History Narrative   Not on file   Social Determinants of Health   Financial Resource Strain: Not on file  Food Insecurity: Not on file  Transportation Needs: Not on file  Physical Activity: Not on file  Stress: Not on file  Social Connections: Not on file     Review of Systems  Constitutional:  Negative for appetite change and unexpected weight change.  HENT:  Negative for congestion and sinus pressure.   Respiratory:  Negative for cough, chest tightness and shortness of breath.   Cardiovascular:  Negative for chest pain and palpitations.  Gastrointestinal:  Negative for abdominal pain, diarrhea, nausea and vomiting.  Genitourinary:  Negative for difficulty urinating and dysuria.  Musculoskeletal:  Negative for joint swelling and myalgias.  Skin:  Negative for color change and rash.  Neurological:  Negative for dizziness and headaches.  Psychiatric/Behavioral:  Negative for agitation and dysphoric mood.        Objective:     BP 136/86   Pulse 72   Temp (!) 97.4 F (36.3 C) (Oral)   Ht 5\' 11"  (1.803 m)   Wt 299 lb 6.4 oz (135.8 kg)   SpO2 98%  BMI 41.76 kg/m  Wt Readings from Last 3 Encounters:  06/03/22 299 lb 6.4 oz (135.8 kg)  04/01/22 293 lb (132.9 kg)  12/18/21 290 lb (131.5 kg)    Physical Exam Constitutional:      General: He is not in acute distress.    Appearance: Normal appearance. He is well-developed.  HENT:     Head: Normocephalic and atraumatic.     Right Ear: External ear normal.     Left Ear: External ear normal.  Eyes:     General: No scleral icterus.       Right eye: No discharge.        Left eye: No discharge.  Cardiovascular:     Rate and Rhythm: Normal rate and regular rhythm.  Pulmonary:     Effort: Pulmonary effort is normal. No respiratory distress.     Breath sounds: Normal breath sounds.  Abdominal:      General: Bowel sounds are normal.     Palpations: Abdomen is soft.     Tenderness: There is no abdominal tenderness.  Musculoskeletal:        General: No swelling or tenderness.     Cervical back: Neck supple. No tenderness.  Lymphadenopathy:     Cervical: No cervical adenopathy.  Skin:    Findings: No erythema or rash.  Neurological:     Mental Status: He is alert.  Psychiatric:        Mood and Affect: Mood normal.        Behavior: Behavior normal.      Outpatient Encounter Medications as of 06/03/2022  Medication Sig   fish oil-omega-3 fatty acids 1000 MG capsule Take 1 g by mouth daily.   metoprolol tartrate (LOPRESSOR) 25 MG tablet Take 1 tablet (25 mg total) by mouth 2 (two) times daily.   Multiple Vitamin (MULTIVITAMIN) tablet Take 1 tablet by mouth daily.   valsartan (DIOVAN) 320 MG tablet Take 1 tablet (320 mg total) by mouth daily.   [DISCONTINUED] valsartan (DIOVAN) 160 MG tablet Take 1 tablet (160 mg total) by mouth daily.   No facility-administered encounter medications on file as of 06/03/2022.     Lab Results  Component Value Date   WBC 5.7 09/28/2021   HGB 14.9 09/28/2021   HCT 44.0 09/28/2021   PLT 223.0 09/28/2021   GLUCOSE 114 (H) 03/30/2022   CHOL 134 03/30/2022   TRIG 178.0 (H) 03/30/2022   HDL 43.70 03/30/2022   LDLCALC 55 03/30/2022   ALT 58 (H) 03/30/2022   AST 34 03/30/2022   NA 141 03/30/2022   K 4.3 03/30/2022   CL 103 03/30/2022   CREATININE 0.96 03/30/2022   BUN 12 03/30/2022   CO2 28 03/30/2022   TSH 1.27 03/30/2022   PSA 0.13 04/15/2021   HGBA1C 6.3 03/30/2022    No results found.     Assessment & Plan:  Primary hypertension Assessment & Plan: Blood pressure remaining elevated above goal. Continues on metoprolol.  Now on diovan 160mg  q day.  Will increase to 320mg . Follow pressures.  Follow metabolic panel.  Send in blood pressure readings.     Abnormal liver function Assessment & Plan: Previous ultrasound revealed fatty  liver.   Have discussed diet and exercise.  Follow liver panel. Have discussed f/u ultrasound. Wanted to hold.     History of colonic polyps Assessment & Plan: Colonoscopy 12/08/21 - 41mm polyp transverse colon. Pathology -  COLON POLYP, TRANSVERSE; COLD SNARE: SESSILE SERRATED POLYP.  NEGATIVE FOR DYSPLASIA AND  MALIGNANCY. Repeat colonoscopy in 5 years.   Hyperglycemia Assessment & Plan: Low carb diet and exercise.  Follow met b and a1c.  Lab Results  Component Value Date   HGBA1C 6.3 03/30/2022       Sleep apnea, unspecified type Assessment & Plan: Recent diagnosis - sleep apnea. Continues cpap.  Using regularly.    Other orders -     Valsartan; Take 1 tablet (320 mg total) by mouth daily.  Dispense: 90 tablet; Refill: 1     Einar Pheasant, MD

## 2022-06-03 NOTE — Patient Instructions (Signed)
Increase diovan to '320mg'$  per day.

## 2022-06-13 ENCOUNTER — Encounter: Payer: Self-pay | Admitting: Internal Medicine

## 2022-06-13 NOTE — Assessment & Plan Note (Signed)
Low carb diet and exercise.  Follow met b and a1c.  Lab Results  Component Value Date   HGBA1C 6.3 03/30/2022     

## 2022-06-13 NOTE — Assessment & Plan Note (Signed)
Colonoscopy 12/08/21 - 6mm polyp transverse colon. Pathology -  COLON POLYP, TRANSVERSE; COLD SNARE: SESSILE SERRATED POLYP.  NEGATIVE FOR DYSPLASIA AND MALIGNANCY. Repeat colonoscopy in 5 years. 

## 2022-06-13 NOTE — Assessment & Plan Note (Signed)
Blood pressure remaining elevated above goal. Continues on metoprolol.  Now on diovan 160mg  q day.  Will increase to 320mg . Follow pressures.  Follow metabolic panel.  Send in blood pressure readings.

## 2022-06-13 NOTE — Assessment & Plan Note (Signed)
Recent diagnosis - sleep apnea. Continues cpap.  Using regularly.  

## 2022-06-13 NOTE — Assessment & Plan Note (Signed)
Previous ultrasound revealed fatty liver.   Have discussed diet and exercise.  Follow liver panel. Have discussed f/u ultrasound. Wanted to hold.

## 2022-07-23 DIAGNOSIS — G4733 Obstructive sleep apnea (adult) (pediatric): Secondary | ICD-10-CM | POA: Diagnosis not present

## 2022-07-28 ENCOUNTER — Other Ambulatory Visit (INDEPENDENT_AMBULATORY_CARE_PROVIDER_SITE_OTHER): Payer: BC Managed Care – PPO

## 2022-07-28 DIAGNOSIS — M545 Low back pain, unspecified: Secondary | ICD-10-CM

## 2022-07-28 DIAGNOSIS — I10 Essential (primary) hypertension: Secondary | ICD-10-CM | POA: Diagnosis not present

## 2022-07-28 DIAGNOSIS — R739 Hyperglycemia, unspecified: Secondary | ICD-10-CM | POA: Diagnosis not present

## 2022-07-28 DIAGNOSIS — Z13220 Encounter for screening for lipoid disorders: Secondary | ICD-10-CM

## 2022-07-28 LAB — CBC WITH DIFFERENTIAL/PLATELET
Basophils Absolute: 0 10*3/uL (ref 0.0–0.1)
Basophils Relative: 0.5 % (ref 0.0–3.0)
Eosinophils Absolute: 0.1 10*3/uL (ref 0.0–0.7)
Eosinophils Relative: 2.6 % (ref 0.0–5.0)
HCT: 43.8 % (ref 39.0–52.0)
Hemoglobin: 14.8 g/dL (ref 13.0–17.0)
Lymphocytes Relative: 30.7 % (ref 12.0–46.0)
Lymphs Abs: 1.5 10*3/uL (ref 0.7–4.0)
MCHC: 33.7 g/dL (ref 30.0–36.0)
MCV: 88.8 fl (ref 78.0–100.0)
Monocytes Absolute: 0.5 10*3/uL (ref 0.1–1.0)
Monocytes Relative: 10.5 % (ref 3.0–12.0)
Neutro Abs: 2.8 10*3/uL (ref 1.4–7.7)
Neutrophils Relative %: 55.7 % (ref 43.0–77.0)
Platelets: 244 10*3/uL (ref 150.0–400.0)
RBC: 4.94 Mil/uL (ref 4.22–5.81)
RDW: 13.8 % (ref 11.5–15.5)
WBC: 5 10*3/uL (ref 4.0–10.5)

## 2022-07-28 LAB — URINALYSIS, ROUTINE W REFLEX MICROSCOPIC
Bilirubin Urine: NEGATIVE
Hgb urine dipstick: NEGATIVE
Ketones, ur: NEGATIVE
Leukocytes,Ua: NEGATIVE
Nitrite: NEGATIVE
RBC / HPF: NONE SEEN (ref 0–?)
Specific Gravity, Urine: 1.025 (ref 1.000–1.030)
Total Protein, Urine: NEGATIVE
Urine Glucose: NEGATIVE
Urobilinogen, UA: 1 (ref 0.0–1.0)
pH: 6 (ref 5.0–8.0)

## 2022-07-28 LAB — HEPATIC FUNCTION PANEL
ALT: 139 U/L — ABNORMAL HIGH (ref 0–53)
AST: 87 U/L — ABNORMAL HIGH (ref 0–37)
Albumin: 4.2 g/dL (ref 3.5–5.2)
Alkaline Phosphatase: 45 U/L (ref 39–117)
Bilirubin, Direct: 0.2 mg/dL (ref 0.0–0.3)
Total Bilirubin: 0.7 mg/dL (ref 0.2–1.2)
Total Protein: 7.3 g/dL (ref 6.0–8.3)

## 2022-07-28 LAB — BASIC METABOLIC PANEL
BUN: 14 mg/dL (ref 6–23)
CO2: 26 mEq/L (ref 19–32)
Calcium: 9.2 mg/dL (ref 8.4–10.5)
Chloride: 104 mEq/L (ref 96–112)
Creatinine, Ser: 0.92 mg/dL (ref 0.40–1.50)
GFR: 91.74 mL/min (ref >60.00)
Glucose, Bld: 120 mg/dL — ABNORMAL HIGH (ref 70–99)
Potassium: 4 mEq/L (ref 3.5–5.1)
Sodium: 140 mEq/L (ref 135–145)

## 2022-07-28 LAB — LIPID PANEL
Cholesterol: 123 mg/dL (ref 0–200)
HDL: 40 mg/dL (ref >39.00)
LDL Cholesterol: 53 mg/dL (ref 0–99)
NonHDL: 83.41
Total CHOL/HDL Ratio: 3
Triglycerides: 154 mg/dL — ABNORMAL HIGH (ref 0.0–149.0)
VLDL: 30.8 mg/dL (ref 0.0–40.0)

## 2022-07-28 LAB — HEMOGLOBIN A1C: Hgb A1c MFr Bld: 6.7 % — ABNORMAL HIGH (ref 4.6–6.5)

## 2022-07-29 ENCOUNTER — Telehealth: Payer: Self-pay

## 2022-07-29 NOTE — Telephone Encounter (Signed)
-----   Message from Dale [place], MD sent at 07/29/2022  4:41 AM EDT ----- Please call and notify - liver function tests increased.  Increased from last check.  He had previously wanted to hold on abdominal ultrasound.  Given the amount of increase, I would like to check abdominal ultrasound to further evaluate.  Confirm no acute GI symptoms. Cholesterol levels are ok.  Overall sugar increased.  A1c 6.7.  (c/w diabetes).  Recommend a low carb diet and exercise.  Hgb and kidney function tests wnl.  Will plan for repeat liver panel at upcoming appt.

## 2022-07-30 ENCOUNTER — Telehealth: Payer: Self-pay

## 2022-07-30 NOTE — Telephone Encounter (Signed)
Left message to call the office back regarding lab results  

## 2022-07-30 NOTE — Telephone Encounter (Signed)
-----   Message from Charlene Scott, MD sent at 07/29/2022  4:41 AM EDT ----- Please call and notify - liver function tests increased.  Increased from last check.  He had previously wanted to hold on abdominal ultrasound.  Given the amount of increase, I would like to check abdominal ultrasound to further evaluate.  Confirm no acute GI symptoms. Cholesterol levels are ok.  Overall sugar increased.  A1c 6.7.  (c/w diabetes).  Recommend a low carb diet and exercise.  Hgb and kidney function tests wnl.  Will plan for repeat liver panel at upcoming appt.  

## 2022-08-03 ENCOUNTER — Ambulatory Visit: Payer: BC Managed Care – PPO | Admitting: Internal Medicine

## 2022-08-03 ENCOUNTER — Other Ambulatory Visit: Payer: Self-pay | Admitting: Internal Medicine

## 2022-08-03 DIAGNOSIS — R7989 Other specified abnormal findings of blood chemistry: Secondary | ICD-10-CM

## 2022-08-03 NOTE — Progress Notes (Signed)
Order placed for abdominal ultrasound.   

## 2022-08-06 ENCOUNTER — Ambulatory Visit (INDEPENDENT_AMBULATORY_CARE_PROVIDER_SITE_OTHER): Payer: BC Managed Care – PPO | Admitting: Internal Medicine

## 2022-08-06 ENCOUNTER — Encounter: Payer: Self-pay | Admitting: Internal Medicine

## 2022-08-06 VITALS — BP 136/74 | HR 70 | Temp 98.0°F | Resp 16 | Ht 72.0 in | Wt 300.8 lb

## 2022-08-06 DIAGNOSIS — R7989 Other specified abnormal findings of blood chemistry: Secondary | ICD-10-CM | POA: Diagnosis not present

## 2022-08-06 DIAGNOSIS — R945 Abnormal results of liver function studies: Secondary | ICD-10-CM | POA: Diagnosis not present

## 2022-08-06 DIAGNOSIS — E1165 Type 2 diabetes mellitus with hyperglycemia: Secondary | ICD-10-CM | POA: Diagnosis not present

## 2022-08-06 DIAGNOSIS — Z86010 Personal history of colonic polyps: Secondary | ICD-10-CM

## 2022-08-06 DIAGNOSIS — I10 Essential (primary) hypertension: Secondary | ICD-10-CM

## 2022-08-06 LAB — HEPATIC FUNCTION PANEL
ALT: 141 U/L — ABNORMAL HIGH (ref 0–53)
AST: 115 U/L — ABNORMAL HIGH (ref 0–37)
Albumin: 4.2 g/dL (ref 3.5–5.2)
Alkaline Phosphatase: 41 U/L (ref 39–117)
Bilirubin, Direct: 0.2 mg/dL (ref 0.0–0.3)
Total Bilirubin: 0.7 mg/dL (ref 0.2–1.2)
Total Protein: 7.3 g/dL (ref 6.0–8.3)

## 2022-08-06 LAB — GAMMA GT: GGT: 45 U/L (ref 7–51)

## 2022-08-06 MED ORDER — METOPROLOL TARTRATE 25 MG PO TABS: 25.0000 mg | ORAL_TABLET | Freq: Two times a day (BID) | ORAL | 1 refills | Status: DC

## 2022-08-06 MED ORDER — VALSARTAN 320 MG PO TABS: 320.0000 mg | ORAL_TABLET | Freq: Every day | ORAL | 1 refills | Status: DC

## 2022-08-06 NOTE — Assessment & Plan Note (Signed)
Previous ultrasound revealed fatty liver.   Have discussed diet and exercise.  Follow liver panel. Have discussed f/u ultrasound. Recent liver panel - increased LFTs.

## 2022-08-06 NOTE — Telephone Encounter (Signed)
Reviewed with him at his appt.

## 2022-08-06 NOTE — Progress Notes (Unsigned)
Subjective:    [name], male    DOB: Aug 19, 1965, 58 y.o.   MRN: [id number]  Patient here for  Chief Complaint  Patient presents with   Medical Management of Chronic Issues    HPI Here to follow up regarding hypertension. Diovan increased to 320mg  q day last visit.  Blood pressures reviewed.  Most averaging 116-130s/70-80s.  Some readings in the 140s systolic range.  No chest pain or sob reported.  No abdominal pain reported.  Some soft stool - q day.  Discussed decreasing intake of artifical sweetener.  Discussed possible use of benefiber.  Discussed recent lab results and elevated liver function tests, fatty liver and diet and exercise.  A1c increased.     Past Medical History:  Diagnosis Date   Environmental allergies    History of kidney stones    Hypertension    Nephrolithiasis    Sleep apnea    Past Surgical History:  Procedure Laterality Date   COLONOSCOPY WITH PROPOFOL N/A 06/16/2015   Procedure: COLONOSCOPY WITH PROPOFOL;  Surgeon: Scot Jun, MD;  Location: [facility] ENDOSCOPY;  Service: Endoscopy;  Laterality: N/A;   COLONOSCOPY WITH PROPOFOL N/A 12/08/2021   Procedure: COLONOSCOPY WITH PROPOFOL;  Surgeon: Toney Reil, MD;  Location: [facility] ENDOSCOPY;  Service: Gastroenterology;  Laterality: N/A;   EXTRACORPOREAL SHOCK WAVE LITHOTRIPSY Right 10/08/2021   Procedure: EXTRACORPOREAL SHOCK WAVE LITHOTRIPSY (ESWL);  Surgeon: Vanna Scotland, MD;  Location: [facility] ORS;  Service: Urology;  Laterality: Right;   Family History  Problem Relation Age of Onset   Hypertension Father    Hypertension Mother    Diabetes Mother    Down syndrome Sister    Prostate cancer Neg Hx    Colon cancer Neg Hx    Social History   Socioeconomic History   Marital status: Married    Spouse name: Not on file   Number of children: 2   Years of education: Not on file   Highest education level: Associate degree: occupational, Scientist, product/process development, or vocational program   Occupational History   Not on file  Tobacco Use   Smoking status: Never    Passive exposure: Never   Smokeless tobacco: Never  Vaping Use   Vaping Use: Never used  Substance and Sexual Activity   Alcohol use: No    Alcohol/week: 0.0 standard drinks of alcohol   Drug use: No   Sexual activity: Not on file  Other Topics Concern   Not on file  Social History Narrative   Not on file   Social Determinants of Health   Financial Resource Strain: Low Risk  (08/05/2022)   Overall Financial Resource Strain (CARDIA)    Difficulty of Paying Living Expenses: Not hard at all  Food Insecurity: No Food Insecurity (08/05/2022)   Hunger Vital Sign    Worried About Running Out of Food in the Last Year: Never true    Ran Out of Food in the Last Year: Never true  Transportation Needs: No Transportation Needs (08/05/2022)   PRAPARE - Administrator, Civil Service (Medical): No    Lack of Transportation (Non-Medical): No  Physical Activity: Insufficiently Active (08/05/2022)   Exercise Vital Sign    Days of Exercise per Week: 1 day    Minutes of Exercise per Session: 10 min  Stress: No Stress Concern Present (08/05/2022)   [employer] of Occupational Health - Occupational Stress Questionnaire    Feeling of Stress : Not at all  Social Connections:  Moderately Integrated (08/05/2022)   Social Connection and Isolation Panel [NHANES]    Frequency of Communication with Friends and Family: More than three times a week    Frequency of Social Gatherings with Friends and Family: More than three times a week    Attends Religious Services: More than 4 times per year    Active Member of [employer] or Organizations: No    Attends Engineer, structural: Not on file    Marital Status: Married     Review of Systems     Objective:     BP 136/74   Pulse 70   Temp 98 F (36.7 C)   Resp 16   Ht 6' (1.829 m)   Wt (!) 300 lb 12.8 oz (136.4 kg)   SpO2 98%   BMI 40.80 kg/m  Wt  Readings from Last 3 Encounters:  08/06/22 (!) 300 lb 12.8 oz (136.4 kg)  06/03/22 299 lb 6.4 oz (135.8 kg)  04/01/22 293 lb (132.9 kg)    Physical Exam   Outpatient Encounter Medications as of 08/06/2022  Medication Sig   fish oil-omega-3 fatty acids 1000 MG capsule Take 1 g by mouth daily.   metoprolol tartrate (LOPRESSOR) 25 MG tablet Take 1 tablet (25 mg total) by mouth 2 (two) times daily.   Multiple Vitamin (MULTIVITAMIN) tablet Take 1 tablet by mouth daily.   valsartan (DIOVAN) 320 MG tablet Take 1 tablet (320 mg total) by mouth daily.   [DISCONTINUED] metoprolol tartrate (LOPRESSOR) 25 MG tablet Take 1 tablet (25 mg total) by mouth 2 (two) times daily.   [DISCONTINUED] valsartan (DIOVAN) 320 MG tablet Take 1 tablet (320 mg total) by mouth daily.   No facility-administered encounter medications on file as of 08/06/2022.     Lab Results  Component Value Date   WBC 5.0 07/28/2022   HGB 14.8 07/28/2022   HCT 43.8 07/28/2022   PLT 244.0 07/28/2022   GLUCOSE 120 (H) 07/28/2022   CHOL 123 07/28/2022   TRIG 154.0 (H) 07/28/2022   HDL 40.00 07/28/2022   LDLCALC 53 07/28/2022   ALT 139 (H) 07/28/2022   AST 87 (H) 07/28/2022   NA 140 07/28/2022   K 4.0 07/28/2022   CL 104 07/28/2022   CREATININE 0.92 07/28/2022   BUN 14 07/28/2022   CO2 26 07/28/2022   TSH 1.27 03/30/2022   PSA 0.13 04/15/2021   HGBA1C 6.7 (H) 07/28/2022    No results found.     Assessment & Plan:  Abnormal liver function Assessment & Plan: Previous ultrasound revealed fatty liver.   Have discussed diet and exercise.  Follow liver panel. Have discussed f/u ultrasound. Recent liver panel - increased LFTs.    Orders: -     Hepatic function panel  Abnormal liver function tests -     Gamma GT  Other orders -     Metoprolol Tartrate; Take 1 tablet (25 mg total) by mouth 2 (two) times daily.  Dispense: 180 tablet; Refill: 1 -     Valsartan; Take 1 tablet (320 mg total) by mouth daily.  Dispense: 90  tablet; Refill: 1     Dale Bloomfield, MD

## 2022-08-09 ENCOUNTER — Telehealth: Payer: Self-pay

## 2022-08-09 ENCOUNTER — Encounter: Payer: Self-pay | Admitting: Internal Medicine

## 2022-08-09 NOTE — Telephone Encounter (Signed)
-----   Message from Charlene Scott, MD sent at 08/09/2022  4:52 AM EDT ----- Please call and notify liver function tests remain elevated.  ALT leveled out - still elevated.  Is scheduled for abdominal ultrasound Tuesday.  Will await ultrasound results.  Further w/up/evaluation pending results.  

## 2022-08-09 NOTE — Assessment & Plan Note (Signed)
Colonoscopy 12/08/21 - 6mm polyp transverse colon. Pathology -  COLON POLYP, TRANSVERSE; COLD SNARE: SESSILE SERRATED POLYP.  NEGATIVE FOR DYSPLASIA AND MALIGNANCY. Repeat colonoscopy in 5 years. 

## 2022-08-09 NOTE — Telephone Encounter (Signed)
-----   Message from Dale , MD sent at 08/09/2022  4:52 AM EDT ----- Please call and notify liver function tests remain elevated.  ALT leveled out - still elevated.  Is scheduled for abdominal ultrasound Tuesday.  Will await ultrasound results.  Further w/up/evaluation pending results.

## 2022-08-09 NOTE — Assessment & Plan Note (Signed)
Blood pressure as outlined.  Now on diovan 320mg . Follow pressures.  Follow metabolic panel.  He was questioning if this change could have contributed to his elevated blood pressure.  Will continue.  Recheck labs as outlined.  Await ultrasound results.

## 2022-08-09 NOTE — Assessment & Plan Note (Signed)
A1c increased recent check - 6.7.  discussed this is c/w diabetes.  Discussed diet and exercise.  Will hold on medication at this time.  Follow met b and A1c.

## 2022-08-10 ENCOUNTER — Ambulatory Visit
Admission: RE | Admit: 2022-08-10 | Discharge: 2022-08-10 | Disposition: A | Discharge status: Home / Self Care | Payer: BC Managed Care – PPO | Source: Ambulatory Visit | Attending: Internal Medicine | Admitting: Internal Medicine

## 2022-08-10 DIAGNOSIS — R7989 Other specified abnormal findings of blood chemistry: Secondary | ICD-10-CM | POA: Diagnosis not present

## 2022-08-10 DIAGNOSIS — R161 Splenomegaly, not elsewhere classified: Secondary | ICD-10-CM | POA: Diagnosis not present

## 2022-08-11 ENCOUNTER — Other Ambulatory Visit: Payer: Self-pay | Admitting: Internal Medicine

## 2022-08-11 ENCOUNTER — Encounter: Payer: Self-pay | Admitting: Internal Medicine

## 2022-08-11 DIAGNOSIS — R7989 Other specified abnormal findings of blood chemistry: Secondary | ICD-10-CM

## 2022-08-11 MED ORDER — LISINOPRIL 40 MG PO TABS
40.0000 mg | ORAL_TABLET | Freq: Every day | ORAL | 1 refills | Status: DC
Start: 2022-08-11 — End: 2023-03-08

## 2022-08-11 NOTE — Progress Notes (Signed)
Rx sent in for lisinopril 40mg .  Stop diovan

## 2022-08-11 NOTE — Telephone Encounter (Signed)
Spoke to [place].  He is going to stop diovan.  Rx sent in for lisinopril.  Recheck liver panel in 3-4 weeks.  Need to schedule non fasting lab appt.

## 2022-08-23 DIAGNOSIS — G4733 Obstructive sleep apnea (adult) (pediatric): Secondary | ICD-10-CM | POA: Diagnosis not present

## 2022-08-26 NOTE — Telephone Encounter (Signed)
Repeat liver function scheduled. Lab ordered.

## 2022-09-02 ENCOUNTER — Other Ambulatory Visit (INDEPENDENT_AMBULATORY_CARE_PROVIDER_SITE_OTHER): Payer: BC Managed Care – PPO

## 2022-09-02 DIAGNOSIS — R7989 Other specified abnormal findings of blood chemistry: Secondary | ICD-10-CM

## 2022-09-02 LAB — HEPATIC FUNCTION PANEL
ALT: 75 U/L — ABNORMAL HIGH (ref 0–53)
AST: 52 U/L — ABNORMAL HIGH (ref 0–37)
Albumin: 4.3 g/dL (ref 3.5–5.2)
Alkaline Phosphatase: 49 U/L (ref 39–117)
Bilirubin, Direct: 0.1 mg/dL (ref 0.0–0.3)
Total Bilirubin: 0.5 mg/dL (ref 0.2–1.2)
Total Protein: 7.5 g/dL (ref 6.0–8.3)

## 2022-09-22 DIAGNOSIS — G4733 Obstructive sleep apnea (adult) (pediatric): Secondary | ICD-10-CM | POA: Diagnosis not present

## 2022-10-04 ENCOUNTER — Ambulatory Visit: Payer: BC Managed Care – PPO | Admitting: Physician Assistant

## 2022-10-20 ENCOUNTER — Encounter (INDEPENDENT_AMBULATORY_CARE_PROVIDER_SITE_OTHER): Payer: Self-pay

## 2022-10-26 ENCOUNTER — Telehealth: Payer: Self-pay | Admitting: Internal Medicine

## 2022-10-26 DIAGNOSIS — I10 Essential (primary) hypertension: Secondary | ICD-10-CM

## 2022-10-26 DIAGNOSIS — E1165 Type 2 diabetes mellitus with hyperglycemia: Secondary | ICD-10-CM

## 2022-10-26 NOTE — Telephone Encounter (Signed)
Order placed for labs.

## 2022-10-26 NOTE — Telephone Encounter (Signed)
Patient need lab orders.

## 2022-11-03 ENCOUNTER — Other Ambulatory Visit (INDEPENDENT_AMBULATORY_CARE_PROVIDER_SITE_OTHER): Payer: BC Managed Care – PPO

## 2022-11-03 DIAGNOSIS — I10 Essential (primary) hypertension: Secondary | ICD-10-CM

## 2022-11-03 DIAGNOSIS — E1165 Type 2 diabetes mellitus with hyperglycemia: Secondary | ICD-10-CM | POA: Diagnosis not present

## 2022-11-03 LAB — HEPATIC FUNCTION PANEL
ALT: 77 U/L — ABNORMAL HIGH (ref 0–53)
AST: 46 U/L — ABNORMAL HIGH (ref 0–37)
Albumin: 4.2 g/dL (ref 3.5–5.2)
Alkaline Phosphatase: 43 U/L (ref 39–117)
Bilirubin, Direct: 0.1 mg/dL (ref 0.0–0.3)
Total Bilirubin: 0.4 mg/dL (ref 0.2–1.2)
Total Protein: 7.2 g/dL (ref 6.0–8.3)

## 2022-11-03 LAB — BASIC METABOLIC PANEL
BUN: 10 mg/dL (ref 6–23)
CO2: 27 mEq/L (ref 19–32)
Calcium: 9.2 mg/dL (ref 8.4–10.5)
Chloride: 102 mEq/L (ref 96–112)
Creatinine, Ser: 0.94 mg/dL (ref 0.40–1.50)
GFR: 89.23 mL/min (ref >60.00)
Glucose, Bld: 145 mg/dL — ABNORMAL HIGH (ref 70–99)
Potassium: 4.2 mEq/L (ref 3.5–5.1)
Sodium: 137 mEq/L (ref 135–145)

## 2022-11-03 LAB — HEMOGLOBIN A1C: Hgb A1c MFr Bld: 6.5 % (ref 4.6–6.5)

## 2022-11-03 LAB — LIPID PANEL
Cholesterol: 130 mg/dL (ref 0–200)
HDL: 39.5 mg/dL (ref >39.00)
LDL Cholesterol: 54 mg/dL (ref 0–99)
NonHDL: 90.23
Total CHOL/HDL Ratio: 3
Triglycerides: 180 mg/dL — ABNORMAL HIGH (ref 0.0–149.0)
VLDL: 36 mg/dL (ref 0.0–40.0)

## 2022-11-03 LAB — MICROALBUMIN / CREATININE URINE RATIO
Creatinine,U: 107.8 mg/dL
Microalb Creat Ratio: 0.6 mg/g (ref 0.0–30.0)
Microalb, Ur: <0.7 mg/dL (ref 0.0–1.9)

## 2022-11-09 ENCOUNTER — Ambulatory Visit: Payer: BC Managed Care – PPO | Admitting: Internal Medicine

## 2022-11-15 ENCOUNTER — Ambulatory Visit (INDEPENDENT_AMBULATORY_CARE_PROVIDER_SITE_OTHER): Payer: BC Managed Care – PPO | Admitting: Internal Medicine

## 2022-11-15 ENCOUNTER — Encounter: Payer: Self-pay | Admitting: Internal Medicine

## 2022-11-15 VITALS — BP 132/74 | HR 85 | Temp 97.8°F | Ht 71.0 in | Wt 293.0 lb

## 2022-11-15 DIAGNOSIS — R945 Abnormal results of liver function studies: Secondary | ICD-10-CM

## 2022-11-15 DIAGNOSIS — G4730 Sleep apnea, unspecified: Secondary | ICD-10-CM

## 2022-11-15 DIAGNOSIS — Z86010 Personal history of colonic polyps: Secondary | ICD-10-CM | POA: Diagnosis not present

## 2022-11-15 DIAGNOSIS — I10 Essential (primary) hypertension: Secondary | ICD-10-CM

## 2022-11-15 DIAGNOSIS — Z9109 Other allergy status, other than to drugs and biological substances: Secondary | ICD-10-CM | POA: Diagnosis not present

## 2022-11-15 DIAGNOSIS — E1165 Type 2 diabetes mellitus with hyperglycemia: Secondary | ICD-10-CM

## 2022-11-15 LAB — HM DIABETES FOOT EXAM

## 2022-11-15 NOTE — Progress Notes (Signed)
Subjective:    [name], male    DOB: 1965/09/08, 57 y.o.   MRN: [id number]  Patient here for  Chief Complaint  Patient presents with   Medical Management of Chronic Issues    HPI Here to follow up regarding hypercholesterolemia, hypertension and diabetes. Reports he has been doing relatively well. Recently has developed some increased nasal congestion/drainage.  No documented fever.  No chest congestion or sob.  No abdominal pain or bowel change reported. Increased stress - work.  Discussed.  Overall appears to be handling things well. Has lost weight.  Discussed continued diet and exercise.    Past Medical History:  Diagnosis Date   Environmental allergies    History of kidney stones    Hypertension    Nephrolithiasis    Sleep apnea    Past Surgical History:  Procedure Laterality Date   COLONOSCOPY WITH PROPOFOL N/A 06/16/2015   Procedure: COLONOSCOPY WITH PROPOFOL;  Surgeon: Scot Jun, MD;  Location: [facility] ENDOSCOPY;  Service: Endoscopy;  Laterality: N/A;   COLONOSCOPY WITH PROPOFOL N/A 12/08/2021   Procedure: COLONOSCOPY WITH PROPOFOL;  Surgeon: Toney Reil, MD;  Location: [facility] ENDOSCOPY;  Service: Gastroenterology;  Laterality: N/A;   EXTRACORPOREAL SHOCK WAVE LITHOTRIPSY Right 10/08/2021   Procedure: EXTRACORPOREAL SHOCK WAVE LITHOTRIPSY (ESWL);  Surgeon: Vanna Scotland, MD;  Location: [facility] ORS;  Service: Urology;  Laterality: Right;   Family History  Problem Relation Age of Onset   Hypertension Father    Hypertension Mother    Diabetes Mother    Down syndrome Sister    Prostate cancer Neg Hx    Colon cancer Neg Hx    Social History   Socioeconomic History   Marital status: Married    Spouse name: Not on file   Number of children: 2   Years of education: Not on file   Highest education level: Associate degree: occupational, Scientist, product/process development, or vocational program  Occupational History   Not on file  Tobacco Use   Smoking status:  Never    Passive exposure: Never   Smokeless tobacco: Never  Vaping Use   Vaping status: Never Used  Substance and Sexual Activity   Alcohol use: No    Alcohol/week: 0.0 standard drinks of alcohol   Drug use: No   Sexual activity: Not on file  Other Topics Concern   Not on file  Social History Narrative   Not on file   Social Determinants of Health   Financial Resource Strain: Low Risk  (08/05/2022)   Overall Financial Resource Strain (CARDIA)    Difficulty of Paying Living Expenses: Not hard at all  Food Insecurity: No Food Insecurity (08/05/2022)   Hunger Vital Sign    Worried About Running Out of Food in the Last Year: Never true    Ran Out of Food in the Last Year: Never true  Transportation Needs: No Transportation Needs (08/05/2022)   PRAPARE - Administrator, Civil Service (Medical): No    Lack of Transportation (Non-Medical): No  Physical Activity: Insufficiently Active (08/05/2022)   Exercise Vital Sign    Days of Exercise per Week: 1 day    Minutes of Exercise per Session: 10 min  Stress: No Stress Concern Present (08/05/2022)   [employer] of Occupational Health - Occupational Stress Questionnaire    Feeling of Stress : Not at all  Social Connections: Moderately Integrated (08/05/2022)   Social Connection and Isolation Panel [NHANES]    Frequency of Communication with  Friends and Family: More than three times a week    Frequency of Social Gatherings with Friends and Family: More than three times a week    Attends Religious Services: More than 4 times per year    Active Member of [employer] or Organizations: No    Attends Engineer, structural: Not on file    Marital Status: Married     Review of Systems  Constitutional:  Negative for appetite change and fever.  HENT:  Positive for congestion and postnasal drip.   Respiratory:  Negative for chest tightness and shortness of breath.        No increased chest congestion or cough.    Cardiovascular:  Negative for chest pain, palpitations and leg swelling.  Gastrointestinal:  Negative for abdominal pain, diarrhea, nausea and vomiting.  Genitourinary:  Negative for difficulty urinating and dysuria.  Musculoskeletal:  Negative for joint swelling and myalgias.  Skin:  Negative for color change and rash.  Neurological:  Negative for dizziness and headaches.  Psychiatric/Behavioral:  Negative for agitation and dysphoric mood.        Objective:     BP 132/74   Pulse 85   Temp 97.8 F (36.6 C)   Ht 5\' 11"  (1.803 m)   Wt 293 lb (132.9 kg)   SpO2 97%   BMI 40.87 kg/m  Wt Readings from Last 3 Encounters:  11/15/22 293 lb (132.9 kg)  08/06/22 (!) 300 lb 12.8 oz (136.4 kg)  06/03/22 299 lb 6.4 oz (135.8 kg)    Physical Exam Vitals reviewed.  Constitutional:      General: He is not in acute distress.    Appearance: Normal appearance. He is well-developed.  HENT:     Head: Normocephalic and atraumatic.     Right Ear: External ear normal.     Left Ear: External ear normal.  Eyes:     General: No scleral icterus.       Right eye: No discharge.        Left eye: No discharge.     Conjunctiva/sclera: Conjunctivae normal.  Cardiovascular:     Rate and Rhythm: Normal rate and regular rhythm.  Pulmonary:     Effort: Pulmonary effort is normal. No respiratory distress.     Breath sounds: Normal breath sounds.  Abdominal:     General: Bowel sounds are normal.     Palpations: Abdomen is soft.     Tenderness: There is no abdominal tenderness.  Musculoskeletal:        General: No swelling or tenderness.     Cervical back: Neck supple. No tenderness.  Lymphadenopathy:     Cervical: No cervical adenopathy.  Skin:    Findings: No erythema or rash.  Neurological:     Mental Status: He is alert.  Psychiatric:        Mood and Affect: Mood normal.        Behavior: Behavior normal.      Outpatient Encounter Medications as of 11/15/2022  Medication Sig    lisinopril (ZESTRIL) 40 MG tablet Take 1 tablet (40 mg total) by mouth daily.   fish oil-omega-3 fatty acids 1000 MG capsule Take 1 g by mouth daily.   metoprolol tartrate (LOPRESSOR) 25 MG tablet Take 1 tablet (25 mg total) by mouth 2 (two) times daily.   Multiple Vitamin (MULTIVITAMIN) tablet Take 1 tablet by mouth daily.   No facility-administered encounter medications on file as of 11/15/2022.     Lab Results  Component Value  Date   WBC 5.0 07/28/2022   HGB 14.8 07/28/2022   HCT 43.8 07/28/2022   PLT 244.0 07/28/2022   GLUCOSE 145 (H) 11/03/2022   CHOL 130 11/03/2022   TRIG 180.0 (H) 11/03/2022   HDL 39.50 11/03/2022   LDLCALC 54 11/03/2022   ALT 77 (H) 11/03/2022   AST 46 (H) 11/03/2022   NA 137 11/03/2022   K 4.2 11/03/2022   CL 102 11/03/2022   CREATININE 0.94 11/03/2022   BUN 10 11/03/2022   CO2 27 11/03/2022   TSH 1.27 03/30/2022   PSA 0.13 04/15/2021   HGBA1C 6.5 11/03/2022   MICROALBUR <0.7 11/03/2022    US Abdomen Complete  Result Date: 08/10/2022 CLINICAL DATA:  Abnormal LFTs EXAM: ABDOMEN ULTRASOUND COMPLETE COMPARISON:  None Available. FINDINGS: Gallbladder: No gallstones or wall thickening visualized. No sonographic Murphy sign noted by sonographer. Common bile duct: Diameter: 2.6 mm Liver: Diffuse increased echogenicity. No mass. Portal vein is patent on color Doppler imaging with normal direction of blood flow towards the liver. IVC: No abnormality visualized. Pancreas: Visualized portion unremarkable. Spleen: Splenomegaly with a length of 14.2 cm in a volume of 431 cc. Right Kidney: Length: 11.3 cm. Echogenicity within normal limits. No mass or hydronephrosis visualized. Left Kidney: Length: 13 cm. Echogenicity within normal limits. No mass or hydronephrosis visualized. Abdominal aorta: No aneurysm visualized. Other findings: None. IMPRESSION: 1. Splenomegaly. 2. Diffuse increased echogenicity in the liver is nonspecific but often due to hepatic steatosis. 3. No  other abnormalities. Electronically Signed   By: Gerome Sam III M.D.   On: 08/10/2022 13:55       Assessment & Plan:  Abnormal liver function Assessment & Plan: Previous ultrasound revealed fatty liver.   Have discussed diet and exercise.  Follow liver panel.  Has lost some weight. Recent abdominal ultrasound - fatty liver and splenomegaly.  Discussed GI referral given ultrasound findings.  Desires to hold on referral.  Follow.   Orders: -     Hepatic function panel; Future  Environmental allergies Assessment & Plan: With increased congestion and drainage as outlined.  Steroid nasal spray and mucinex as directed.  Follow.  Call with update. Hold abx at this time.    History of colonic polyps Assessment & Plan: Colonoscopy 12/08/21 - 6mm polyp transverse colon. Pathology -  COLON POLYP, TRANSVERSE; COLD SNARE: SESSILE SERRATED POLYP.  NEGATIVE FOR DYSPLASIA AND MALIGNANCY. Repeat colonoscopy in 5 years.   Primary hypertension Assessment & Plan: On lisinopril and metoprolol.  Pressure as outlined.  Follow pressures.  Follow metabolic panel.    Sleep apnea, unspecified type Assessment & Plan: Recent diagnosis - sleep apnea. Continues cpap.  Using regularly.    Type 2 diabetes mellitus with hyperglycemia, without long-term current use of insulin (HCC) Assessment & Plan: A1c increased recent check - 6.5. Decreased from last check.  Has lost weight.  Discussed continued diet and exercise.  Follow met b and A1c.       Dale Itta Bena, MD

## 2022-11-17 ENCOUNTER — Encounter: Payer: Self-pay | Admitting: Internal Medicine

## 2022-11-19 MED ORDER — CEFDINIR 300 MG PO CAPS: 300.0000 mg | ORAL_CAPSULE | Freq: Two times a day (BID) | ORAL | 0 refills | Status: DC

## 2022-11-19 NOTE — Telephone Encounter (Signed)
Medication has been refilled.

## 2022-11-19 NOTE — Telephone Encounter (Signed)
Given persistent symptoms despite otc medication - confirm nKDA.  Will treat with omnicef 300mg  bid x 1 week.  Take a probiotic daily while on abx and for two weeks after.

## 2022-11-19 NOTE — Telephone Encounter (Signed)
He is having sinus pressure and hoarseness. Confirmed no other acute symptoms. He has been using mucinex and took a claritin today but the mucinex has not been helping. Does not feel bad. No one around him has been sick. He has not taken a covid test.

## 2022-11-20 ENCOUNTER — Encounter: Payer: Self-pay | Admitting: Internal Medicine

## 2022-11-20 NOTE — Assessment & Plan Note (Signed)
On lisinopril and metoprolol.  Pressure as outlined.  Follow pressures.  Follow metabolic panel.

## 2022-11-20 NOTE — Assessment & Plan Note (Signed)
With increased congestion and drainage as outlined.  Steroid nasal spray and mucinex as directed.  Follow.  Call with update. Hold abx at this time.

## 2022-11-20 NOTE — Assessment & Plan Note (Signed)
A1c increased recent check - 6.5. Decreased from last check.  Has lost weight.  Discussed continued diet and exercise.  Follow met b and A1c.

## 2022-11-20 NOTE — Assessment & Plan Note (Signed)
Previous ultrasound revealed fatty liver.   Have discussed diet and exercise.  Follow liver panel.  Has lost some weight. Recent abdominal ultrasound - fatty liver and splenomegaly.  Discussed GI referral given ultrasound findings.  Desires to hold on referral.  Follow.

## 2022-11-20 NOTE — Assessment & Plan Note (Signed)
Recent diagnosis - sleep apnea. Continues cpap.  Using regularly.

## 2022-11-20 NOTE — Assessment & Plan Note (Signed)
Colonoscopy 12/08/21 - 6mm polyp transverse colon. Pathology -  COLON POLYP, TRANSVERSE; COLD SNARE: SESSILE SERRATED POLYP.  NEGATIVE FOR DYSPLASIA AND MALIGNANCY. Repeat colonoscopy in 5 years. 

## 2022-12-16 DIAGNOSIS — Z23 Encounter for immunization: Secondary | ICD-10-CM | POA: Diagnosis not present

## 2023-01-10 ENCOUNTER — Other Ambulatory Visit (INDEPENDENT_AMBULATORY_CARE_PROVIDER_SITE_OTHER): Payer: BC Managed Care – PPO

## 2023-01-10 DIAGNOSIS — R945 Abnormal results of liver function studies: Secondary | ICD-10-CM

## 2023-01-10 LAB — HEPATIC FUNCTION PANEL
ALT: 63 U/L — ABNORMAL HIGH (ref 0–53)
AST: 34 U/L (ref 0–37)
Albumin: 4.3 g/dL (ref 3.5–5.2)
Alkaline Phosphatase: 45 U/L (ref 39–117)
Bilirubin, Direct: 0.1 mg/dL (ref 0.0–0.3)
Total Bilirubin: 0.4 mg/dL (ref 0.2–1.2)
Total Protein: 7 g/dL (ref 6.0–8.3)

## 2023-01-19 DIAGNOSIS — G4733 Obstructive sleep apnea (adult) (pediatric): Secondary | ICD-10-CM | POA: Diagnosis not present

## 2023-02-19 DIAGNOSIS — G4733 Obstructive sleep apnea (adult) (pediatric): Secondary | ICD-10-CM | POA: Diagnosis not present

## 2023-03-08 ENCOUNTER — Other Ambulatory Visit: Payer: Self-pay

## 2023-03-08 ENCOUNTER — Encounter: Payer: Self-pay | Admitting: Internal Medicine

## 2023-03-08 MED ORDER — LISINOPRIL 40 MG PO TABS: 40.0000 mg | ORAL_TABLET | Freq: Every day | ORAL | 1 refills | Status: DC

## 2023-03-21 DIAGNOSIS — G4733 Obstructive sleep apnea (adult) (pediatric): Secondary | ICD-10-CM | POA: Diagnosis not present

## 2023-03-28 ENCOUNTER — Telehealth: Payer: Self-pay | Admitting: Internal Medicine

## 2023-03-28 DIAGNOSIS — E1165 Type 2 diabetes mellitus with hyperglycemia: Secondary | ICD-10-CM

## 2023-03-28 DIAGNOSIS — Z13220 Encounter for screening for lipoid disorders: Secondary | ICD-10-CM

## 2023-03-28 DIAGNOSIS — R945 Abnormal results of liver function studies: Secondary | ICD-10-CM

## 2023-03-28 DIAGNOSIS — I10 Essential (primary) hypertension: Secondary | ICD-10-CM

## 2023-03-28 NOTE — Telephone Encounter (Signed)
 Patient need lab orders.

## 2023-03-29 NOTE — Telephone Encounter (Signed)
 Labs ordered.

## 2023-03-29 NOTE — Addendum Note (Signed)
 Addended by: Rita Ohara D on: 03/29/2023 07:33 AM   Modules accepted: Orders

## 2023-04-01 ENCOUNTER — Other Ambulatory Visit (INDEPENDENT_AMBULATORY_CARE_PROVIDER_SITE_OTHER): Payer: BC Managed Care – PPO

## 2023-04-01 DIAGNOSIS — E1165 Type 2 diabetes mellitus with hyperglycemia: Secondary | ICD-10-CM

## 2023-04-01 DIAGNOSIS — I10 Essential (primary) hypertension: Secondary | ICD-10-CM | POA: Diagnosis not present

## 2023-04-01 DIAGNOSIS — Z13220 Encounter for screening for lipoid disorders: Secondary | ICD-10-CM | POA: Diagnosis not present

## 2023-04-01 DIAGNOSIS — R945 Abnormal results of liver function studies: Secondary | ICD-10-CM

## 2023-04-01 LAB — LIPID PANEL
Cholesterol: 127 mg/dL (ref 0–200)
HDL: 37.7 mg/dL — ABNORMAL LOW (ref >39.00)
LDL Cholesterol: 68 mg/dL (ref 0–99)
NonHDL: 89.41
Total CHOL/HDL Ratio: 3
Triglycerides: 105 mg/dL (ref 0.0–149.0)
VLDL: 21 mg/dL (ref 0.0–40.0)

## 2023-04-01 LAB — BASIC METABOLIC PANEL
BUN: 14 mg/dL (ref 6–23)
CO2: 27 meq/L (ref 19–32)
Calcium: 9.2 mg/dL (ref 8.4–10.5)
Chloride: 103 meq/L (ref 96–112)
Creatinine, Ser: 0.98 mg/dL (ref 0.40–1.50)
GFR: 84.64 mL/min (ref >60.00)
Glucose, Bld: 127 mg/dL — ABNORMAL HIGH (ref 70–99)
Potassium: 4.4 meq/L (ref 3.5–5.1)
Sodium: 140 meq/L (ref 135–145)

## 2023-04-01 LAB — HEPATIC FUNCTION PANEL
ALT: 77 U/L — ABNORMAL HIGH (ref 0–53)
AST: 52 U/L — ABNORMAL HIGH (ref 0–37)
Albumin: 4.5 g/dL (ref 3.5–5.2)
Alkaline Phosphatase: 46 U/L (ref 39–117)
Bilirubin, Direct: 0.1 mg/dL (ref 0.0–0.3)
Total Bilirubin: 0.5 mg/dL (ref 0.2–1.2)
Total Protein: 7.5 g/dL (ref 6.0–8.3)

## 2023-04-01 LAB — TSH: TSH: 0.78 u[IU]/mL (ref 0.35–5.50)

## 2023-04-01 LAB — HEMOGLOBIN A1C: Hgb A1c MFr Bld: 6.3 % (ref 4.6–6.5)

## 2023-04-04 ENCOUNTER — Ambulatory Visit (INDEPENDENT_AMBULATORY_CARE_PROVIDER_SITE_OTHER): Payer: BC Managed Care – PPO | Admitting: Internal Medicine

## 2023-04-04 VITALS — BP 128/78 | HR 69 | Temp 97.9°F | Resp 16 | Ht 71.0 in | Wt 293.4 lb

## 2023-04-04 DIAGNOSIS — Z0000 Encounter for general adult medical examination without abnormal findings: Secondary | ICD-10-CM | POA: Diagnosis not present

## 2023-04-04 DIAGNOSIS — I10 Essential (primary) hypertension: Secondary | ICD-10-CM

## 2023-04-04 DIAGNOSIS — G4730 Sleep apnea, unspecified: Secondary | ICD-10-CM

## 2023-04-04 DIAGNOSIS — E1165 Type 2 diabetes mellitus with hyperglycemia: Secondary | ICD-10-CM

## 2023-04-04 DIAGNOSIS — Z125 Encounter for screening for malignant neoplasm of prostate: Secondary | ICD-10-CM

## 2023-04-04 DIAGNOSIS — R945 Abnormal results of liver function studies: Secondary | ICD-10-CM

## 2023-04-04 NOTE — Assessment & Plan Note (Signed)
On lisinopril and metoprolol.  Pressure as outlined.  Follow pressures.  Follow metabolic panel.

## 2023-04-04 NOTE — Assessment & Plan Note (Signed)
 A1c increased recent check - 6.3. Decreased from last check.  Discussed continued diet and exercise.  Follow met b and A1c.

## 2023-04-04 NOTE — Assessment & Plan Note (Signed)
 Physical today 04/04/23.  PSA - 04/15/21 - .13.   Check PSA with next labs. Colonoscopy 12/08/21.  Recommended f/u in 5 years.

## 2023-04-04 NOTE — Assessment & Plan Note (Signed)
 Continues cpap.  Using regularly. Feels better. Benefiting from use.

## 2023-04-04 NOTE — Assessment & Plan Note (Signed)
 Previous ultrasound revealed fatty liver. Have discussed diet and exercise. Has lost some weight. Abdominal ultrasound 08/10/22 - fatty liver and splenomegaly. Recent liver panel - persistent elevation AST/ALT. Discussed GI referral given ultrasound findings. Agreeable for referral.

## 2023-04-04 NOTE — Progress Notes (Signed)
 Subjective:    [name], male    DOB: Jun 05, 1965, 58 y.o.   MRN: [id number]  Patient here for  Chief Complaint  Patient presents with   Annual Exam    HPI Here for a physical exam. Increased stress - work. Previous ultrasound revealed fatty liver. Have discussed diet and exercise. Has lost some weight. Abdominal ultrasound 08/10/22 - fatty liver and splenomegaly. Recent liver panel - persistent elevation AST/ALT. Discussed GI referral given ultrasound findings. Agreeable for referral. Continues cpap for sleep apnea. Doing well with this. Feels rested. Discussed exercise. Has not been able to exercise as much recently due to cold weather. Discussed labs.    Past Medical History:  Diagnosis Date   Environmental allergies    History of kidney stones    Hypertension    Nephrolithiasis    Sleep apnea    Past Surgical History:  Procedure Laterality Date   COLONOSCOPY WITH PROPOFOL  N/A 06/16/2015   Procedure: COLONOSCOPY WITH PROPOFOL ;  Surgeon: Lamar ONEIDA Holmes, MD;  Location: [facility] ENDOSCOPY;  Service: Endoscopy;  Laterality: N/A;   COLONOSCOPY WITH PROPOFOL  N/A 12/08/2021   Procedure: COLONOSCOPY WITH PROPOFOL ;  Surgeon: Unk Corinn Skiff, MD;  Location: [facility] ENDOSCOPY;  Service: Gastroenterology;  Laterality: N/A;   EXTRACORPOREAL SHOCK WAVE LITHOTRIPSY Right 10/08/2021   Procedure: EXTRACORPOREAL SHOCK WAVE LITHOTRIPSY (ESWL);  Surgeon: Penne Knee, MD;  Location: [facility] ORS;  Service: Urology;  Laterality: Right;   Family History  Problem Relation Age of Onset   Hypertension Father    Hypertension Mother    Diabetes Mother    Down syndrome Sister    Prostate cancer Neg Hx    Colon cancer Neg Hx    Social History   Socioeconomic History   Marital status: Married    Spouse name: Not on file   Number of children: 2   Years of education: Not on file   Highest education level: Associate degree: occupational, scientist, product/process development, or vocational program   Occupational History   Not on file  Tobacco Use   Smoking status: Never    Passive exposure: Never   Smokeless tobacco: Never  Vaping Use   Vaping status: Never Used  Substance and Sexual Activity   Alcohol use: No    Alcohol/week: 0.0 standard drinks of alcohol   Drug use: No   Sexual activity: Not on file  Other Topics Concern   Not on file  Social History Narrative   Not on file   Social Drivers of Health   Financial Resource Strain: Low Risk  (04/04/2023)   Overall Financial Resource Strain (CARDIA)    Difficulty of Paying Living Expenses: Not hard at all  Food Insecurity: No Food Insecurity (04/04/2023)   Hunger Vital Sign    Worried About Running Out of Food in the Last Year: Never true    Ran Out of Food in the Last Year: Never true  Transportation Needs: No Transportation Needs (04/04/2023)   PRAPARE - Administrator, Civil Service (Medical): No    Lack of Transportation (Non-Medical): No  Physical Activity: Insufficiently Active (04/04/2023)   Exercise Vital Sign    Days of Exercise per Week: 1 day    Minutes of Exercise per Session: 20 min  Stress: No Stress Concern Present (04/04/2023)   [employer] of Occupational Health - Occupational Stress Questionnaire    Feeling of Stress : Not at all  Social Connections: Moderately Integrated (04/04/2023)   Social Connection and Isolation Panel [  NHANES]    Frequency of Communication with Friends and Family: More than three times a week    Frequency of Social Gatherings with Friends and Family: More than three times a week    Attends Religious Services: More than 4 times per year    Active Member of [employer] or Organizations: No    Attends Engineer, Structural: Not on file    Marital Status: Married     Review of Systems  Constitutional:  Negative for appetite change and unexpected weight change.  HENT:  Negative for congestion, sinus pressure and sore throat.   Eyes:  Negative for pain and  visual disturbance.  Respiratory:  Negative for cough, chest tightness and shortness of breath.   Cardiovascular:  Negative for chest pain, palpitations and leg swelling.  Gastrointestinal:  Negative for abdominal pain, diarrhea, nausea and vomiting.  Genitourinary:  Negative for difficulty urinating and dysuria.  Musculoskeletal:  Negative for joint swelling and myalgias.  Skin:  Negative for color change and rash.  Neurological:  Negative for dizziness and headaches.  Hematological:  Negative for adenopathy. Does not bruise/bleed easily.  Psychiatric/Behavioral:  Negative for agitation and dysphoric mood.        Objective:     BP 128/78   Pulse 69   Temp 97.9 F (36.6 C)   Resp 16   Ht 5' 11 (1.803 m)   Wt 293 lb 6.4 oz (133.1 kg)   SpO2 98%   BMI 40.92 kg/m  Wt Readings from Last 3 Encounters:  04/04/23 293 lb 6.4 oz (133.1 kg)  11/15/22 293 lb (132.9 kg)  08/06/22 (!) 300 lb 12.8 oz (136.4 kg)    Physical Exam Vitals reviewed.  Constitutional:      General: He is not in acute distress.    Appearance: Normal appearance. He is well-developed.  HENT:     Head: Normocephalic and atraumatic.     Right Ear: External ear normal.     Left Ear: External ear normal.     Mouth/Throat:     Pharynx: No oropharyngeal exudate or posterior oropharyngeal erythema (.).  Eyes:     General: No scleral icterus.       Right eye: No discharge.        Left eye: No discharge.     Conjunctiva/sclera: Conjunctivae normal.  Cardiovascular:     Rate and Rhythm: Normal rate and regular rhythm.  Pulmonary:     Effort: Pulmonary effort is normal. No respiratory distress.     Breath sounds: Normal breath sounds.  Abdominal:     General: Bowel sounds are normal.     Palpations: Abdomen is soft.     Tenderness: There is no abdominal tenderness.  Musculoskeletal:        General: No swelling or tenderness.     Cervical back: Neck supple. No tenderness.  Lymphadenopathy:     Cervical: No  cervical adenopathy.  Skin:    Findings: No erythema or rash.  Neurological:     Mental Status: He is alert.  Psychiatric:        Mood and Affect: Mood normal.        Behavior: Behavior normal.      Outpatient Encounter Medications as of 04/04/2023  Medication Sig   fish oil-omega-3 fatty acids 1000 MG capsule Take 1 g by mouth daily.   lisinopril  (ZESTRIL ) 40 MG tablet Take 1 tablet (40 mg total) by mouth daily.   metoprolol  tartrate (LOPRESSOR ) 25 MG tablet Take  1 tablet (25 mg total) by mouth 2 (two) times daily.   Multiple Vitamin (MULTIVITAMIN) tablet Take 1 tablet by mouth daily.   [DISCONTINUED] cefdinir  (OMNICEF ) 300 MG capsule Take 1 capsule (300 mg total) by mouth 2 (two) times daily.   No facility-administered encounter medications on file as of 04/04/2023.     Lab Results  Component Value Date   WBC 5.0 07/28/2022   HGB 14.8 07/28/2022   HCT 43.8 07/28/2022   PLT 244.0 07/28/2022   GLUCOSE 127 (H) 04/01/2023   CHOL 127 04/01/2023   TRIG 105.0 04/01/2023   HDL 37.70 (L) 04/01/2023   LDLCALC 68 04/01/2023   ALT 77 (H) 04/01/2023   AST 52 (H) 04/01/2023   NA 140 04/01/2023   K 4.4 04/01/2023   CL 103 04/01/2023   CREATININE 0.98 04/01/2023   BUN 14 04/01/2023   CO2 27 04/01/2023   TSH 0.78 04/01/2023   PSA 0.13 04/15/2021   HGBA1C 6.3 04/01/2023   MICROALBUR <0.7 11/03/2022    US  Abdomen Complete Result Date: 08/10/2022 CLINICAL DATA:  Abnormal LFTs EXAM: ABDOMEN ULTRASOUND COMPLETE COMPARISON:  None Available. FINDINGS: Gallbladder: No gallstones or wall thickening visualized. No sonographic Murphy sign noted by sonographer. Common bile duct: Diameter: 2.6 mm Liver: Diffuse increased echogenicity. No mass. Portal vein is patent on color Doppler imaging with normal direction of blood flow towards the liver. IVC: No abnormality visualized. Pancreas: Visualized portion unremarkable. Spleen: Splenomegaly with a length of 14.2 cm in a volume of 431 cc. Right  Kidney: Length: 11.3 cm. Echogenicity within normal limits. No mass or hydronephrosis visualized. Left Kidney: Length: 13 cm. Echogenicity within normal limits. No mass or hydronephrosis visualized. Abdominal aorta: No aneurysm visualized. Other findings: None. IMPRESSION: 1. Splenomegaly. 2. Diffuse increased echogenicity in the liver is nonspecific but often due to hepatic steatosis. 3. No other abnormalities. Electronically Signed   By: Alm Pouch III M.D.   On: 08/10/2022 13:55       Assessment & Plan:  Routine general medical examination at a health care facility  Healthcare maintenance Assessment & Plan: Physical today 04/04/23.  PSA - 04/15/21 - .13.   Check PSA with next labs. Colonoscopy 12/08/21.  Recommended f/u in 5 years.      Type 2 diabetes mellitus with hyperglycemia, without long-term current use of insulin (HCC) Assessment & Plan: A1c increased recent check - 6.3. Decreased from last check.  Discussed continued diet and exercise.  Follow met b and A1c.   Orders: -     Hemoglobin A1c; Future -     Lipid panel; Future  Sleep apnea, unspecified type Assessment & Plan: Continues cpap.  Using regularly. Feels better. Benefiting from use.   Orders: -     CBC with Differential/Platelet; Future  Primary hypertension Assessment & Plan: On lisinopril  and metoprolol .  Pressure as outlined.  Follow pressures.  Follow metabolic panel.   Orders: -     Basic metabolic panel; Future  Abnormal liver function Assessment & Plan: Previous ultrasound revealed fatty liver. Have discussed diet and exercise. Has lost some weight. Abdominal ultrasound 08/10/22 - fatty liver and splenomegaly. Recent liver panel - persistent elevation AST/ALT. Discussed GI referral given ultrasound findings. Agreeable for referral.   Orders: -     Hepatic function panel; Future -     Ambulatory referral to Gastroenterology  Prostate cancer screening -     PSA; Future     Allena Hamilton, MD

## 2023-07-28 ENCOUNTER — Encounter (HOSPITAL_COMMUNITY): Payer: Self-pay

## 2023-08-01 ENCOUNTER — Other Ambulatory Visit (INDEPENDENT_AMBULATORY_CARE_PROVIDER_SITE_OTHER): Payer: BC Managed Care – PPO

## 2023-08-01 DIAGNOSIS — I10 Essential (primary) hypertension: Secondary | ICD-10-CM | POA: Diagnosis not present

## 2023-08-01 DIAGNOSIS — G4730 Sleep apnea, unspecified: Secondary | ICD-10-CM

## 2023-08-01 DIAGNOSIS — R945 Abnormal results of liver function studies: Secondary | ICD-10-CM

## 2023-08-01 DIAGNOSIS — E1165 Type 2 diabetes mellitus with hyperglycemia: Secondary | ICD-10-CM | POA: Diagnosis not present

## 2023-08-01 DIAGNOSIS — Z125 Encounter for screening for malignant neoplasm of prostate: Secondary | ICD-10-CM | POA: Diagnosis not present

## 2023-08-01 LAB — CBC WITH DIFFERENTIAL/PLATELET
Basophils Absolute: 0 10*3/uL (ref 0.0–0.1)
Basophils Relative: 0.9 % (ref 0.0–3.0)
Eosinophils Absolute: 0.1 10*3/uL (ref 0.0–0.7)
Eosinophils Relative: 2.1 % (ref 0.0–5.0)
HCT: 45 % (ref 39.0–52.0)
Hemoglobin: 15 g/dL (ref 13.0–17.0)
Lymphocytes Relative: 27.5 % (ref 12.0–46.0)
Lymphs Abs: 1.4 10*3/uL (ref 0.7–4.0)
MCHC: 33.3 g/dL (ref 30.0–36.0)
MCV: 87.7 fl (ref 78.0–100.0)
Monocytes Absolute: 0.5 10*3/uL (ref 0.1–1.0)
Monocytes Relative: 10.4 % (ref 3.0–12.0)
Neutro Abs: 2.9 10*3/uL (ref 1.4–7.7)
Neutrophils Relative %: 59.1 % (ref 43.0–77.0)
Platelets: 243 10*3/uL (ref 150.0–400.0)
RBC: 5.14 Mil/uL (ref 4.22–5.81)
RDW: 14 % (ref 11.5–15.5)
WBC: 4.9 10*3/uL (ref 4.0–10.5)

## 2023-08-01 LAB — LIPID PANEL
Cholesterol: 132 mg/dL (ref 0–200)
HDL: 40.9 mg/dL (ref >39.00)
LDL Cholesterol: 62 mg/dL (ref 0–99)
NonHDL: 91.31
Total CHOL/HDL Ratio: 3
Triglycerides: 146 mg/dL (ref 0.0–149.0)
VLDL: 29.2 mg/dL (ref 0.0–40.0)

## 2023-08-01 LAB — HEPATIC FUNCTION PANEL
ALT: 56 U/L — ABNORMAL HIGH (ref 0–53)
AST: 40 U/L — ABNORMAL HIGH (ref 0–37)
Albumin: 4.3 g/dL (ref 3.5–5.2)
Alkaline Phosphatase: 44 U/L (ref 39–117)
Bilirubin, Direct: 0.1 mg/dL (ref 0.0–0.3)
Total Bilirubin: 0.5 mg/dL (ref 0.2–1.2)
Total Protein: 7.1 g/dL (ref 6.0–8.3)

## 2023-08-01 LAB — BASIC METABOLIC PANEL WITH GFR
BUN: 14 mg/dL (ref 6–23)
CO2: 28 meq/L (ref 19–32)
Calcium: 9.4 mg/dL (ref 8.4–10.5)
Chloride: 102 meq/L (ref 96–112)
Creatinine, Ser: 0.93 mg/dL (ref 0.40–1.50)
GFR: 89.91 mL/min (ref >60.00)
Glucose, Bld: 132 mg/dL — ABNORMAL HIGH (ref 70–99)
Potassium: 4.5 meq/L (ref 3.5–5.1)
Sodium: 139 meq/L (ref 135–145)

## 2023-08-01 LAB — HEMOGLOBIN A1C: Hgb A1c MFr Bld: 6.4 % (ref 4.6–6.5)

## 2023-08-01 LAB — PSA: PSA: 0.15 ng/mL (ref 0.10–4.00)

## 2023-08-04 ENCOUNTER — Encounter: Payer: Self-pay | Admitting: Internal Medicine

## 2023-08-04 ENCOUNTER — Ambulatory Visit: Payer: BC Managed Care – PPO | Admitting: Internal Medicine

## 2023-08-04 VITALS — BP 128/74 | HR 74 | Temp 98.3°F | Resp 16 | Ht 71.0 in | Wt 296.2 lb

## 2023-08-04 DIAGNOSIS — I10 Essential (primary) hypertension: Secondary | ICD-10-CM

## 2023-08-04 DIAGNOSIS — Z860100 Personal history of colon polyps, unspecified: Secondary | ICD-10-CM | POA: Diagnosis not present

## 2023-08-04 DIAGNOSIS — G4730 Sleep apnea, unspecified: Secondary | ICD-10-CM

## 2023-08-04 DIAGNOSIS — E1165 Type 2 diabetes mellitus with hyperglycemia: Secondary | ICD-10-CM

## 2023-08-04 DIAGNOSIS — R945 Abnormal results of liver function studies: Secondary | ICD-10-CM

## 2023-08-04 MED ORDER — LISINOPRIL 40 MG PO TABS: 40.0000 mg | ORAL_TABLET | Freq: Every day | ORAL | 1 refills | Status: DC

## 2023-08-04 MED ORDER — METOPROLOL TARTRATE 25 MG PO TABS: 25.0000 mg | ORAL_TABLET | Freq: Two times a day (BID) | ORAL | 1 refills | Status: DC

## 2023-08-04 NOTE — Assessment & Plan Note (Signed)
 On lisinopril  and metoprolol .  Pressure as outlined.  Continue to spot check pressures.  No changes today.

## 2023-08-04 NOTE — Assessment & Plan Note (Signed)
Colonoscopy 12/08/21 - 6mm polyp transverse colon. Pathology -  COLON POLYP, TRANSVERSE; COLD SNARE: SESSILE SERRATED POLYP.  NEGATIVE FOR DYSPLASIA AND MALIGNANCY. Repeat colonoscopy in 5 years. 

## 2023-08-04 NOTE — Assessment & Plan Note (Signed)
 Previous ultrasound revealed fatty liver. Discussed low carb diet and exercise. Abdominal ultrasound 08/10/22 - fatty liver and splenomegaly. Recent liver panel - persistent elevation AST/ALT. Slightly improved.  Discussed GI referral given ultrasound findings. Referral was placed last visit. No appt scheduled. F/u to get appt scheduled.

## 2023-08-04 NOTE — Assessment & Plan Note (Signed)
 Continue cpap.

## 2023-08-04 NOTE — Assessment & Plan Note (Signed)
 Continue low carb diet and exercise.  Follow met b and A1c.  Recent A1c 6.4.

## 2023-08-04 NOTE — Progress Notes (Addendum)
 Subjective:    [name], male    DOB: 1965/09/23, 58 y.o.   MRN: [id number]  Patient here for  Chief Complaint  Patient presents with   Medical Management of Chronic Issues    HPI Here for a scheduled follow up - follow up regarding hypercholesterolemia and hypertension.  Abdominal ultrasound 08/10/22 - fatty liver and splenomegaly. Was referred to GI - abnormal liver function tests. Has not been scheduled an appt. Discussed. Agreeable for referral. Continue cpap. No chest pain or sob. No cough or congestion. No abdominal pain or bowel change reported. Some persistent left knee pain. Stable. Does not feel needs any further intervention. Discussed labs.    Past Medical History:  Diagnosis Date   Environmental allergies    History of kidney stones    Hypertension    Nephrolithiasis    Sleep apnea    Past Surgical History:  Procedure Laterality Date   COLONOSCOPY WITH PROPOFOL  N/A 06/16/2015   Procedure: COLONOSCOPY WITH PROPOFOL ;  Surgeon: Cassie Click, MD;  Location: [facility] ENDOSCOPY;  Service: Endoscopy;  Laterality: N/A;   COLONOSCOPY WITH PROPOFOL  N/A 12/08/2021   Procedure: COLONOSCOPY WITH PROPOFOL ;  Surgeon: Selena Daily, MD;  Location: [facility] ENDOSCOPY;  Service: Gastroenterology;  Laterality: N/A;   EXTRACORPOREAL SHOCK WAVE LITHOTRIPSY Right 10/08/2021   Procedure: EXTRACORPOREAL SHOCK WAVE LITHOTRIPSY (ESWL);  Surgeon: Dustin Gimenez, MD;  Location: [facility] ORS;  Service: Urology;  Laterality: Right;   Family History  Problem Relation Age of Onset   Hypertension Father    Hypertension Mother    Diabetes Mother    Down syndrome Sister    Prostate cancer Neg Hx    Colon cancer Neg Hx    Social History   Socioeconomic History   Marital status: Married    Spouse name: Not on file   Number of children: 2   Years of education: Not on file   Highest education level: Associate degree: occupational, Scientist, product/process development, or vocational program  Occupational  History   Not on file  Tobacco Use   Smoking status: Never    Passive exposure: Never   Smokeless tobacco: Never  Vaping Use   Vaping status: Never Used  Substance and Sexual Activity   Alcohol use: No    Alcohol/week: 0.0 standard drinks of alcohol   Drug use: No   Sexual activity: Not on file  Other Topics Concern   Not on file  Social History Narrative   Not on file   Social Drivers of Health   Financial Resource Strain: Low Risk  (04/04/2023)   Overall Financial Resource Strain (CARDIA)    Difficulty of Paying Living Expenses: Not hard at all  Food Insecurity: No Food Insecurity (04/04/2023)   Hunger Vital Sign    Worried About Running Out of Food in the Last Year: Never true    Ran Out of Food in the Last Year: Never true  Transportation Needs: No Transportation Needs (04/04/2023)   PRAPARE - Administrator, Civil Service (Medical): No    Lack of Transportation (Non-Medical): No  Physical Activity: Insufficiently Active (04/04/2023)   Exercise Vital Sign    Days of Exercise per Week: 1 day    Minutes of Exercise per Session: 20 min  Stress: No Stress Concern Present (04/04/2023)   [employer] of Occupational Health - Occupational Stress Questionnaire    Feeling of Stress : Not at all  Social Connections: Moderately Integrated (04/04/2023)   Social Connection and  Isolation Panel [NHANES]    Frequency of Communication with Friends and Family: More than three times a week    Frequency of Social Gatherings with Friends and Family: More than three times a week    Attends Religious Services: More than 4 times per year    Active Member of [employer] or Organizations: No    Attends Engineer, structural: Not on file    Marital Status: Married     Review of Systems  Constitutional:  Negative for appetite change and unexpected weight change.  HENT:  Negative for congestion and sinus pressure.   Respiratory:  Negative for cough, chest tightness and  shortness of breath.   Cardiovascular:  Negative for chest pain, palpitations and leg swelling.  Gastrointestinal:  Negative for abdominal pain, diarrhea, nausea and vomiting.  Genitourinary:  Negative for difficulty urinating and dysuria.  Musculoskeletal:  Negative for joint swelling and myalgias.  Skin:  Negative for color change and rash.  Neurological:  Negative for dizziness and headaches.  Psychiatric/Behavioral:  Negative for agitation and dysphoric mood.        Objective:     BP 128/74   Pulse 74   Temp 98.3 F (36.8 C)   Resp 16   Ht 5\' 11"  (1.803 m)   Wt 296 lb 3.2 oz (134.4 kg)   SpO2 99%   BMI 41.31 kg/m  Wt Readings from Last 3 Encounters:  08/04/23 296 lb 3.2 oz (134.4 kg)  04/04/23 293 lb 6.4 oz (133.1 kg)  11/15/22 293 lb (132.9 kg)    Physical Exam Vitals reviewed.  Constitutional:      General: He is not in acute distress.    Appearance: Normal appearance. He is well-developed.  HENT:     Head: Normocephalic and atraumatic.     Right Ear: External ear normal.     Left Ear: External ear normal.     Mouth/Throat:     Pharynx: No oropharyngeal exudate or posterior oropharyngeal erythema.  Eyes:     General: No scleral icterus.       Right eye: No discharge.        Left eye: No discharge.     Conjunctiva/sclera: Conjunctivae normal.  Cardiovascular:     Rate and Rhythm: Normal rate and regular rhythm.  Pulmonary:     Effort: Pulmonary effort is normal. No respiratory distress.     Breath sounds: Normal breath sounds.  Abdominal:     General: Bowel sounds are normal.     Palpations: Abdomen is soft.     Tenderness: There is no abdominal tenderness.  Musculoskeletal:        General: No swelling or tenderness.     Cervical back: Neck supple. No tenderness.  Lymphadenopathy:     Cervical: No cervical adenopathy.  Skin:    Findings: No erythema or rash.  Neurological:     Mental Status: He is alert.  Psychiatric:        Mood and Affect: Mood  normal.        Behavior: Behavior normal.         Outpatient Encounter Medications as of 08/04/2023  Medication Sig   fish oil-omega-3 fatty acids 1000 MG capsule Take 1 g by mouth daily.   Multiple Vitamin (MULTIVITAMIN) tablet Take 1 tablet by mouth daily.   lisinopril  (ZESTRIL ) 40 MG tablet Take 1 tablet (40 mg total) by mouth daily.   metoprolol  tartrate (LOPRESSOR ) 25 MG tablet Take 1 tablet (25 mg total) by  mouth 2 (two) times daily.   [DISCONTINUED] lisinopril  (ZESTRIL ) 40 MG tablet Take 1 tablet (40 mg total) by mouth daily.   [DISCONTINUED] metoprolol  tartrate (LOPRESSOR ) 25 MG tablet Take 1 tablet (25 mg total) by mouth 2 (two) times daily.   No facility-administered encounter medications on file as of 08/04/2023.     Lab Results  Component Value Date   WBC 4.9 08/01/2023   HGB 15.0 08/01/2023   HCT 45.0 08/01/2023   PLT 243.0 08/01/2023   GLUCOSE 132 (H) 08/01/2023   CHOL 132 08/01/2023   TRIG 146.0 08/01/2023   HDL 40.90 08/01/2023   LDLCALC 62 08/01/2023   ALT 56 (H) 08/01/2023   AST 40 (H) 08/01/2023   NA 139 08/01/2023   K 4.5 08/01/2023   CL 102 08/01/2023   CREATININE 0.93 08/01/2023   BUN 14 08/01/2023   CO2 28 08/01/2023   TSH 0.78 04/01/2023   PSA 0.15 08/01/2023   HGBA1C 6.4 08/01/2023   MICROALBUR <0.7 11/03/2022    US  Abdomen Complete Result Date: 08/10/2022 CLINICAL DATA:  Abnormal LFTs EXAM: ABDOMEN ULTRASOUND COMPLETE COMPARISON:  None Available. FINDINGS: Gallbladder: No gallstones or wall thickening visualized. No sonographic Murphy sign noted by sonographer. Common bile duct: Diameter: 2.6 mm Liver: Diffuse increased echogenicity. No mass. Portal vein is patent on color Doppler imaging with normal direction of blood flow towards the liver. IVC: No abnormality visualized. Pancreas: Visualized portion unremarkable. Spleen: Splenomegaly with a length of 14.2 cm in a volume of 431 cc. Right Kidney: Length: 11.3 cm. Echogenicity within normal  limits. No mass or hydronephrosis visualized. Left Kidney: Length: 13 cm. Echogenicity within normal limits. No mass or hydronephrosis visualized. Abdominal aorta: No aneurysm visualized. Other findings: None. IMPRESSION: 1. Splenomegaly. 2. Diffuse increased echogenicity in the liver is nonspecific but often due to hepatic steatosis. 3. No other abnormalities. Electronically Signed   By: Lorrene Rosser III M.D.   On: 08/10/2022 13:55       Assessment & Plan:  History of colonic polyps Assessment & Plan: Colonoscopy 12/08/21 - 6mm polyp transverse colon. Pathology -  COLON POLYP, TRANSVERSE; COLD SNARE: SESSILE SERRATED POLYP.  NEGATIVE FOR DYSPLASIA AND MALIGNANCY. Repeat colonoscopy in 5 years.   Type 2 diabetes mellitus with hyperglycemia, without long-term current use of insulin (HCC) Assessment & Plan: Continue low carb diet and exercise.  Follow met b and A1c.  Recent A1c 6.4.   Orders: -     Lipid panel; Future -     Hemoglobin A1c; Future  Abnormal liver function Assessment & Plan: Previous ultrasound revealed fatty liver. Discussed low carb diet and exercise. Abdominal ultrasound 08/10/22 - fatty liver and splenomegaly. Recent liver panel - persistent elevation AST/ALT. Slightly improved.  Discussed GI referral given ultrasound findings. Referral was placed last visit. No appt scheduled. F/u to get appt scheduled.   Orders: -     Hepatic function panel; Future  Primary hypertension Assessment & Plan: On lisinopril  and metoprolol .  Pressure as outlined.  Continue to spot check pressures.  No changes today.   Orders: -     Basic metabolic panel with GFR; Future  Sleep apnea, unspecified type Assessment & Plan: Continue cpap.    Other orders -     Lisinopril ; Take 1 tablet (40 mg total) by mouth daily.  Dispense: 90 tablet; Refill: 1 -     Metoprolol  Tartrate; Take 1 tablet (25 mg total) by mouth 2 (two) times daily.  Dispense: 180 tablet; Refill: 1  Dellar Fenton, MD

## 2023-11-25 DIAGNOSIS — G4733 Obstructive sleep apnea (adult) (pediatric): Secondary | ICD-10-CM | POA: Diagnosis not present

## 2023-12-06 ENCOUNTER — Ambulatory Visit: Payer: Self-pay | Admitting: Internal Medicine

## 2023-12-06 ENCOUNTER — Other Ambulatory Visit

## 2023-12-06 DIAGNOSIS — I10 Essential (primary) hypertension: Secondary | ICD-10-CM | POA: Diagnosis not present

## 2023-12-06 DIAGNOSIS — R945 Abnormal results of liver function studies: Secondary | ICD-10-CM

## 2023-12-06 DIAGNOSIS — E1165 Type 2 diabetes mellitus with hyperglycemia: Secondary | ICD-10-CM | POA: Diagnosis not present

## 2023-12-06 LAB — LIPID PANEL
Cholesterol: 135 mg/dL (ref 0–200)
HDL: 40.4 mg/dL (ref >39.00)
LDL Cholesterol: 62 mg/dL (ref 0–99)
NonHDL: 94.29
Total CHOL/HDL Ratio: 3
Triglycerides: 162 mg/dL — ABNORMAL HIGH (ref 0.0–149.0)
VLDL: 32.4 mg/dL (ref 0.0–40.0)

## 2023-12-06 LAB — HEMOGLOBIN A1C: Hgb A1c MFr Bld: 6.4 % (ref 4.6–6.5)

## 2023-12-06 LAB — BASIC METABOLIC PANEL WITH GFR
BUN: 14 mg/dL (ref 6–23)
CO2: 29 meq/L (ref 19–32)
Calcium: 9.4 mg/dL (ref 8.4–10.5)
Chloride: 100 meq/L (ref 96–112)
Creatinine, Ser: 0.98 mg/dL (ref 0.40–1.50)
GFR: 84.23 mL/min (ref >60.00)
Glucose, Bld: 113 mg/dL — ABNORMAL HIGH (ref 70–99)
Potassium: 4.3 meq/L (ref 3.5–5.1)
Sodium: 138 meq/L (ref 135–145)

## 2023-12-06 LAB — HEPATIC FUNCTION PANEL
ALT: 35 U/L (ref 0–53)
AST: 28 U/L (ref 0–37)
Albumin: 4.2 g/dL (ref 3.5–5.2)
Alkaline Phosphatase: 42 U/L (ref 39–117)
Bilirubin, Direct: 0.1 mg/dL (ref 0.0–0.3)
Total Bilirubin: 0.7 mg/dL (ref 0.2–1.2)
Total Protein: 7.3 g/dL (ref 6.0–8.3)

## 2023-12-08 ENCOUNTER — Ambulatory Visit: Admitting: Internal Medicine

## 2023-12-08 VITALS — BP 126/70 | HR 68 | Resp 16 | Ht 71.0 in | Wt 291.0 lb

## 2023-12-08 DIAGNOSIS — R21 Rash and other nonspecific skin eruption: Secondary | ICD-10-CM

## 2023-12-08 DIAGNOSIS — E1165 Type 2 diabetes mellitus with hyperglycemia: Secondary | ICD-10-CM | POA: Diagnosis not present

## 2023-12-08 DIAGNOSIS — R945 Abnormal results of liver function studies: Secondary | ICD-10-CM | POA: Diagnosis not present

## 2023-12-08 DIAGNOSIS — Z860100 Personal history of colon polyps, unspecified: Secondary | ICD-10-CM | POA: Diagnosis not present

## 2023-12-08 DIAGNOSIS — I10 Essential (primary) hypertension: Secondary | ICD-10-CM | POA: Diagnosis not present

## 2023-12-08 DIAGNOSIS — G4730 Sleep apnea, unspecified: Secondary | ICD-10-CM

## 2023-12-08 LAB — HM DIABETES FOOT EXAM

## 2023-12-08 MED ORDER — CLOTRIMAZOLE-BETAMETHASONE 1-0.05 % EX CREA: 1.0000 | TOPICAL_CREAM | Freq: Every day | CUTANEOUS | 0 refills | Status: DC

## 2023-12-08 MED ORDER — METOPROLOL TARTRATE 25 MG PO TABS: 25.0000 mg | ORAL_TABLET | Freq: Two times a day (BID) | ORAL | 1 refills | Status: DC

## 2023-12-08 MED ORDER — LISINOPRIL 40 MG PO TABS: 40.0000 mg | ORAL_TABLET | Freq: Every day | ORAL | 1 refills | Status: DC

## 2023-12-08 NOTE — Progress Notes (Signed)
 Subjective:    [name], male    DOB: Feb 05, 1966, 58 y.o.   MRN: [id number]  Patient here for  Chief Complaint  Patient presents with   Medical Management of Chronic Issues    HPI Here for a scheduled follow up - follow up regarding diabetes, hypercholesterolemia and hypertension.  Abdominal ultrasound 08/10/22 - fatty liver and splenomegaly. Was referred to GI - abnormal liver function tests. Continues cpap. Reviewed recent labs. A1c 6.4. recent liver panel wnl. Discussed sugars. He has lost weight. Plans to start walking more. Discussed GLP 1 agonist. Wants to hold on starting. No chest pain or sob reported. No abdominal pain or bowel change reported. Circular rash - right lower abdomen. Localized. Itches at times.    Past Medical History:  Diagnosis Date   Environmental allergies    History of kidney stones    Hypertension    Nephrolithiasis    Sleep apnea    Past Surgical History:  Procedure Laterality Date   COLONOSCOPY WITH PROPOFOL  N/A 06/16/2015   Procedure: COLONOSCOPY WITH PROPOFOL ;  Surgeon: Lamar ONEIDA Holmes, MD;  Location: [facility] ENDOSCOPY;  Service: Endoscopy;  Laterality: N/A;   COLONOSCOPY WITH PROPOFOL  N/A 12/08/2021   Procedure: COLONOSCOPY WITH PROPOFOL ;  Surgeon: Unk Corinn Skiff, MD;  Location: [facility] ENDOSCOPY;  Service: Gastroenterology;  Laterality: N/A;   EXTRACORPOREAL SHOCK WAVE LITHOTRIPSY Right 10/08/2021   Procedure: EXTRACORPOREAL SHOCK WAVE LITHOTRIPSY (ESWL);  Surgeon: Penne Knee, MD;  Location: [facility] ORS;  Service: Urology;  Laterality: Right;   Family History  Problem Relation Age of Onset   Hypertension Father    Hypertension Mother    Diabetes Mother    Down syndrome Sister    Prostate cancer Neg Hx    Colon cancer Neg Hx    Social History   Socioeconomic History   Marital status: Married    Spouse name: Not on file   Number of children: 2   Years of education: Not on file   Highest education level: Associate  degree: occupational, Scientist, product/process development, or vocational program  Occupational History   Not on file  Tobacco Use   Smoking status: Never    Passive exposure: Never   Smokeless tobacco: Never  Vaping Use   Vaping status: Never Used  Substance and Sexual Activity   Alcohol use: No    Alcohol/week: 0.0 standard drinks of alcohol   Drug use: No   Sexual activity: Not on file  Other Topics Concern   Not on file  Social History Narrative   Not on file   Social Drivers of Health   Financial Resource Strain: Low Risk  (12/07/2023)   Overall Financial Resource Strain (CARDIA)    Difficulty of Paying Living Expenses: Not hard at all  Food Insecurity: No Food Insecurity (12/07/2023)   Hunger Vital Sign    Worried About Running Out of Food in the Last Year: Never true    Ran Out of Food in the Last Year: Never true  Transportation Needs: No Transportation Needs (12/07/2023)   PRAPARE - Administrator, Civil Service (Medical): No    Lack of Transportation (Non-Medical): No  Physical Activity: Insufficiently Active (12/07/2023)   Exercise Vital Sign    Days of Exercise per Week: 1 day    Minutes of Exercise per Session: 30 min  Stress: No Stress Concern Present (12/07/2023)   [employer] of Occupational Health - Occupational Stress Questionnaire    Feeling of Stress: Not at all  Social Connections: Moderately Integrated (12/07/2023)   Social Connection and Isolation Panel    Frequency of Communication with Friends and Family: More than three times a week    Frequency of Social Gatherings with Friends and Family: More than three times a week    Attends Religious Services: More than 4 times per year    Active Member of [employer] or Organizations: No    Attends Engineer, structural: Not on file    Marital Status: Married     Review of Systems  Constitutional:  Negative for appetite change and unexpected weight change.  HENT:  Negative for congestion and sinus pressure.    Respiratory:  Negative for cough, chest tightness and shortness of breath.   Cardiovascular:  Negative for chest pain, palpitations and leg swelling.  Gastrointestinal:  Negative for abdominal pain, diarrhea, nausea and vomiting.  Genitourinary:  Negative for difficulty urinating and dysuria.  Musculoskeletal:  Negative for joint swelling and myalgias.  Skin:  Positive for rash. Negative for color change.  Neurological:  Negative for dizziness and headaches.  Psychiatric/Behavioral:  Negative for agitation and dysphoric mood.        Objective:     BP 126/70   Pulse 68   Resp 16   Ht 5' 11 (1.803 m)   Wt 291 lb (132 kg)   SpO2 98%   BMI 40.59 kg/m  Wt Readings from Last 3 Encounters:  12/08/23 291 lb (132 kg)  08/04/23 296 lb 3.2 oz (134.4 kg)  04/04/23 293 lb 6.4 oz (133.1 kg)    Physical Exam Constitutional:      General: He is not in acute distress.    Appearance: Normal appearance. He is well-developed.  HENT:     Head: Normocephalic and atraumatic.     Right Ear: External ear normal.     Left Ear: External ear normal.     Mouth/Throat:     Pharynx: No oropharyngeal exudate or posterior oropharyngeal erythema.  Eyes:     General: No scleral icterus.       Right eye: No discharge.        Left eye: No discharge.  Cardiovascular:     Rate and Rhythm: Normal rate and regular rhythm.  Pulmonary:     Effort: Pulmonary effort is normal. No respiratory distress.     Breath sounds: Normal breath sounds.  Abdominal:     General: Bowel sounds are normal.     Palpations: Abdomen is soft.     Tenderness: There is no abdominal tenderness.  Musculoskeletal:        General: No swelling or tenderness.     Cervical back: Neck supple. No tenderness.  Lymphadenopathy:     Cervical: No cervical adenopathy.  Skin:    Comments: Erythematous based circular rash - right lower abdomen.   Neurological:     Mental Status: He is alert.  Psychiatric:        Mood and Affect: Mood  normal.        Behavior: Behavior normal.      Diabetic foot exam was performed with the following findings:   No deformities, ulcerations, or other skin breakdown Normal sensation of 10g monofilament Intact posterior tibialis and dorsalis pedis pulses      Outpatient Encounter Medications as of 12/08/2023  Medication Sig   clotrimazole -betamethasone  (LOTRISONE ) cream Apply 1 Application topically daily.   fish oil-omega-3 fatty acids 1000 MG capsule Take 1 g by mouth daily.   lisinopril  (ZESTRIL ) 40  MG tablet Take 1 tablet (40 mg total) by mouth daily.   metoprolol  tartrate (LOPRESSOR ) 25 MG tablet Take 1 tablet (25 mg total) by mouth 2 (two) times daily.   Multiple Vitamin (MULTIVITAMIN) tablet Take 1 tablet by mouth daily.   [DISCONTINUED] lisinopril  (ZESTRIL ) 40 MG tablet Take 1 tablet (40 mg total) by mouth daily.   [DISCONTINUED] metoprolol  tartrate (LOPRESSOR ) 25 MG tablet Take 1 tablet (25 mg total) by mouth 2 (two) times daily.   No facility-administered encounter medications on file as of 12/08/2023.     Lab Results  Component Value Date   WBC 4.9 08/01/2023   HGB 15.0 08/01/2023   HCT 45.0 08/01/2023   PLT 243.0 08/01/2023   GLUCOSE 113 (H) 12/06/2023   CHOL 135 12/06/2023   TRIG 162.0 (H) 12/06/2023   HDL 40.40 12/06/2023   LDLCALC 62 12/06/2023   ALT 35 12/06/2023   AST 28 12/06/2023   NA 138 12/06/2023   K 4.3 12/06/2023   CL 100 12/06/2023   CREATININE 0.98 12/06/2023   BUN 14 12/06/2023   CO2 29 12/06/2023   TSH 0.78 04/01/2023   PSA 0.15 08/01/2023   HGBA1C 6.4 12/06/2023    US  Abdomen Complete Result Date: 08/10/2022 CLINICAL DATA:  Abnormal LFTs EXAM: ABDOMEN ULTRASOUND COMPLETE COMPARISON:  None Available. FINDINGS: Gallbladder: No gallstones or wall thickening visualized. No sonographic Murphy sign noted by sonographer. Common bile duct: Diameter: 2.6 mm Liver: Diffuse increased echogenicity. No mass. Portal vein is patent on color Doppler imaging  with normal direction of blood flow towards the liver. IVC: No abnormality visualized. Pancreas: Visualized portion unremarkable. Spleen: Splenomegaly with a length of 14.2 cm in a volume of 431 cc. Right Kidney: Length: 11.3 cm. Echogenicity within normal limits. No mass or hydronephrosis visualized. Left Kidney: Length: 13 cm. Echogenicity within normal limits. No mass or hydronephrosis visualized. Abdominal aorta: No aneurysm visualized. Other findings: None. IMPRESSION: 1. Splenomegaly. 2. Diffuse increased echogenicity in the liver is nonspecific but often due to hepatic steatosis. 3. No other abnormalities. Electronically Signed   By: Alm Pouch III M.D.   On: 08/10/2022 13:55       Assessment & Plan:  History of colonic polyps Assessment & Plan: Colonoscopy 12/08/21 - 6mm polyp transverse colon. Pathology -  COLON POLYP, TRANSVERSE; COLD SNARE: SESSILE SERRATED POLYP.  NEGATIVE FOR DYSPLASIA AND MALIGNANCY. Repeat colonoscopy in 5 years.   Type 2 diabetes mellitus with hyperglycemia, without long-term current use of insulin (HCC) Assessment & Plan: Continue low carb diet and exercise.  Follow met b and A1c.  Discussed recent labs. A1c 6.4. Discussed further treatment, including GLP 1 agonist. Wants to hold on starting. Follow met b and A1c.   Orders: -     Lipid panel; Future -     Hemoglobin A1c; Future -     Microalbumin / creatinine urine ratio; Future  Abnormal liver function Assessment & Plan: Previous ultrasound revealed fatty liver. Discussed low carb diet and exercise. Abdominal ultrasound 08/10/22 - fatty liver and splenomegaly. Recent liver panel - persistent elevation AST/ALT. Slightly improved.  Discussed GI referral given ultrasound findings. Referral was placed last visit. No appt scheduled. Need to confirm f/u scheduled.   Orders: -     Hepatic function panel; Future  Primary hypertension Assessment & Plan: On lisinopril  and metoprolol .  Pressure as outlined.   Continue to spot check pressures.  No changes in medication. Follow pressures. Follow metabolic panel.   Orders: -  Basic metabolic panel with GFR; Future  Sleep apnea, unspecified type Assessment & Plan: Continue cpap. Uses regularly.    Rash Assessment & Plan: Right lower abdominal pain - lotrisone  cream as directed. Follow. Call with update.    Other orders -     Lisinopril ; Take 1 tablet (40 mg total) by mouth daily.  Dispense: 90 tablet; Refill: 1 -     Metoprolol  Tartrate; Take 1 tablet (25 mg total) by mouth 2 (two) times daily.  Dispense: 180 tablet; Refill: 1 -     Clotrimazole -Betamethasone ; Apply 1 Application topically daily.  Dispense: 30 g; Refill: 0     Allena Hamilton, MD

## 2023-12-14 DIAGNOSIS — Z23 Encounter for immunization: Secondary | ICD-10-CM | POA: Diagnosis not present

## 2023-12-17 NOTE — Assessment & Plan Note (Signed)
 Previous ultrasound revealed fatty liver. Discussed low carb diet and exercise. Abdominal ultrasound 08/10/22 - fatty liver and splenomegaly. Recent liver panel - persistent elevation AST/ALT. Slightly improved.  Discussed GI referral given ultrasound findings. Referral was placed last visit. No appt scheduled. Need to confirm f/u scheduled.

## 2023-12-18 ENCOUNTER — Encounter: Payer: Self-pay | Admitting: Internal Medicine

## 2023-12-18 DIAGNOSIS — R21 Rash and other nonspecific skin eruption: Secondary | ICD-10-CM | POA: Insufficient documentation

## 2023-12-18 NOTE — Assessment & Plan Note (Signed)
 Right lower abdominal pain - lotrisone  cream as directed. Follow. Call with update.

## 2023-12-18 NOTE — Assessment & Plan Note (Signed)
Colonoscopy 12/08/21 - 6mm polyp transverse colon. Pathology -  COLON POLYP, TRANSVERSE; COLD SNARE: SESSILE SERRATED POLYP.  NEGATIVE FOR DYSPLASIA AND MALIGNANCY. Repeat colonoscopy in 5 years. 

## 2023-12-18 NOTE — Assessment & Plan Note (Signed)
Continue cpap. Uses regularly.   

## 2023-12-18 NOTE — Assessment & Plan Note (Signed)
 On lisinopril  and metoprolol .  Pressure as outlined.  Continue to spot check pressures.  No changes in medication. Follow pressures. Follow metabolic panel.

## 2023-12-18 NOTE — Assessment & Plan Note (Signed)
 Continue low carb diet and exercise.  Follow met b and A1c.  Discussed recent labs. A1c 6.4. Discussed further treatment, including GLP 1 agonist. Wants to hold on starting. Follow met b and A1c.

## 2024-01-25 DIAGNOSIS — G4733 Obstructive sleep apnea (adult) (pediatric): Secondary | ICD-10-CM | POA: Diagnosis not present

## 2024-01-26 NOTE — Progress Notes (Signed)
 Derek Griffith                                          MRN: [id number]   01/26/2024   The [facility] Quality Team Specialist reviewed this patient medical record for the purposes of chart review for care gap closure. The following were reviewed: chart review for care gap closure-kidney health evaluation for diabetes:eGFR  and uACR.    [facility] Quality Team

## 2024-01-27 ENCOUNTER — Encounter: Payer: Self-pay | Admitting: Pharmacist

## 2024-01-27 NOTE — Progress Notes (Signed)
 Pharmacy Quality Measure Review  This patient is appearing on a report for being at risk of failing the Kidney Health Evaluation for Patients with Diabetes measure this calendar year.   Last documented UACR = Never  Future UACR ordered. Next visit 2026.  Measure will remain open for 2025.

## 2024-04-05 ENCOUNTER — Other Ambulatory Visit

## 2024-04-05 DIAGNOSIS — R945 Abnormal results of liver function studies: Secondary | ICD-10-CM | POA: Diagnosis not present

## 2024-04-05 DIAGNOSIS — E1165 Type 2 diabetes mellitus with hyperglycemia: Secondary | ICD-10-CM | POA: Diagnosis not present

## 2024-04-05 DIAGNOSIS — I10 Essential (primary) hypertension: Secondary | ICD-10-CM | POA: Diagnosis not present

## 2024-04-05 LAB — BASIC METABOLIC PANEL WITH GFR
BUN: 11 mg/dL (ref 6–23)
CO2: 27 meq/L (ref 19–32)
Calcium: 9.1 mg/dL (ref 8.4–10.5)
Chloride: 103 meq/L (ref 96–112)
Creatinine, Ser: 0.93 mg/dL (ref 0.40–1.50)
GFR: 89.49 mL/min
Glucose, Bld: 139 mg/dL — ABNORMAL HIGH (ref 70–99)
Potassium: 4.5 meq/L (ref 3.5–5.1)
Sodium: 137 meq/L (ref 135–145)

## 2024-04-05 LAB — MICROALBUMIN / CREATININE URINE RATIO
Creatinine,U: 125.9 mg/dL
Microalb Creat Ratio: 10.3 mg/g (ref 0.0–30.0)
Microalb, Ur: 1.3 mg/dL (ref 0.7–1.9)

## 2024-04-05 LAB — HEPATIC FUNCTION PANEL
ALT: 66 U/L — ABNORMAL HIGH (ref 3–53)
AST: 44 U/L — ABNORMAL HIGH (ref 5–37)
Albumin: 4.5 g/dL (ref 3.5–5.2)
Alkaline Phosphatase: 45 U/L (ref 39–117)
Bilirubin, Direct: 0.1 mg/dL (ref 0.1–0.3)
Total Bilirubin: 0.5 mg/dL (ref 0.2–1.2)
Total Protein: 7.4 g/dL (ref 6.0–8.3)

## 2024-04-05 LAB — LIPID PANEL
Cholesterol: 143 mg/dL (ref 28–200)
HDL: 41.6 mg/dL
LDL Cholesterol: 65 mg/dL (ref 10–99)
NonHDL: 101.11
Total CHOL/HDL Ratio: 3
Triglycerides: 180 mg/dL — ABNORMAL HIGH (ref 10.0–149.0)
VLDL: 36 mg/dL (ref 0.0–40.0)

## 2024-04-05 LAB — HEMOGLOBIN A1C: Hgb A1c MFr Bld: 6.6 % — ABNORMAL HIGH (ref 4.6–6.5)

## 2024-04-06 ENCOUNTER — Ambulatory Visit: Payer: Self-pay | Admitting: Internal Medicine

## 2024-04-09 ENCOUNTER — Encounter: Payer: Self-pay | Admitting: Internal Medicine

## 2024-04-09 ENCOUNTER — Ambulatory Visit: Admitting: Internal Medicine

## 2024-04-09 VITALS — BP 122/88 | HR 63 | Temp 98.1°F | Ht 71.0 in | Wt 296.4 lb

## 2024-04-09 DIAGNOSIS — Z0000 Encounter for general adult medical examination without abnormal findings: Secondary | ICD-10-CM | POA: Diagnosis not present

## 2024-04-09 DIAGNOSIS — Z7985 Long-term (current) use of injectable non-insulin antidiabetic drugs: Secondary | ICD-10-CM

## 2024-04-09 DIAGNOSIS — Z125 Encounter for screening for malignant neoplasm of prostate: Secondary | ICD-10-CM | POA: Diagnosis not present

## 2024-04-09 DIAGNOSIS — Z136 Encounter for screening for cardiovascular disorders: Secondary | ICD-10-CM

## 2024-04-09 DIAGNOSIS — Z860100 Personal history of colon polyps, unspecified: Secondary | ICD-10-CM

## 2024-04-09 DIAGNOSIS — E1165 Type 2 diabetes mellitus with hyperglycemia: Secondary | ICD-10-CM

## 2024-04-09 DIAGNOSIS — R945 Abnormal results of liver function studies: Secondary | ICD-10-CM | POA: Diagnosis not present

## 2024-04-09 DIAGNOSIS — G4730 Sleep apnea, unspecified: Secondary | ICD-10-CM

## 2024-04-09 DIAGNOSIS — I10 Essential (primary) hypertension: Secondary | ICD-10-CM | POA: Diagnosis not present

## 2024-04-09 MED ORDER — METOPROLOL TARTRATE 25 MG PO TABS: 25.0000 mg | ORAL_TABLET | Freq: Two times a day (BID) | ORAL | 1 refills | Status: AC

## 2024-04-09 MED ORDER — OZEMPIC (0.25 OR 0.5 MG/DOSE) 2 MG/3ML ~~LOC~~ SOPN: 0.2500 mg | PEN_INJECTOR | SUBCUTANEOUS | 1 refills | Status: AC

## 2024-04-09 MED ORDER — LISINOPRIL 40 MG PO TABS: 40.0000 mg | ORAL_TABLET | Freq: Every day | ORAL | 1 refills | Status: AC

## 2024-04-09 NOTE — Assessment & Plan Note (Addendum)
"   Fibrosis 4 Score = 1.34 (Indeterminate)       Interpretation for patients with NAFLD          <1.30       -  F0-F1 (Low risk)          1.30-2.67 -  Indeterminate           >2.67      -  F3-F4 (High risk)     Validated for ages 18-65      Score is based on outdated labs. ALT, AST, and platelets should all be measured within the last 6 months for an accurate FIB-4 Score  Discussed diet and exercise. Discussed weight loss - GLP 1 agonists. Discussed possible side effects. Will start ozempic  .25mg  weekly. Discussed keeping bowels moving. Stay hydrated. Low carb/adequate protein. Follow. Refer to GI as outlined - fatty liver and splenomegaly.  "

## 2024-04-09 NOTE — Progress Notes (Signed)
 "  Subjective:    [name], male    DOB: 01/04/1966, 59 y.o.   MRN: [id number]  Patient here for  Chief Complaint  Patient presents with   Annual Exam    HPI Here for a physical exam. Abdominal ultrasound 08/10/22 - fatty liver and splenomegaly. Had previously referred to GI. Discussed today. Discussed diet and exercise. Had questions about GLP 1 agonists. Discussed the medication. Discussed possible side effects. Continues cpap. Benefiting from use. No chest pain or sob reported. No cough or congestion. No abdominal pain or bowel change.    Past Medical History:  Diagnosis Date   Environmental allergies    History of kidney stones    Hypertension    Nephrolithiasis    Sleep apnea    Past Surgical History:  Procedure Laterality Date   COLONOSCOPY WITH PROPOFOL  N/A 06/16/2015   Procedure: COLONOSCOPY WITH PROPOFOL ;  Surgeon: Lamar ONEIDA Holmes, MD;  Location: [facility] ENDOSCOPY;  Service: Endoscopy;  Laterality: N/A;   COLONOSCOPY WITH PROPOFOL  N/A 12/08/2021   Procedure: COLONOSCOPY WITH PROPOFOL ;  Surgeon: Unk Corinn Skiff, MD;  Location: [facility] ENDOSCOPY;  Service: Gastroenterology;  Laterality: N/A;   EXTRACORPOREAL SHOCK WAVE LITHOTRIPSY Right 10/08/2021   Procedure: EXTRACORPOREAL SHOCK WAVE LITHOTRIPSY (ESWL);  Surgeon: Penne Knee, MD;  Location: [facility] ORS;  Service: Urology;  Laterality: Right;   Family History  Problem Relation Age of Onset   Hypertension Father    Hypertension Mother    Diabetes Mother    Down syndrome Sister    Prostate cancer Neg Hx    Colon cancer Neg Hx    Social History   Socioeconomic History   Marital status: Married    Spouse name: Not on file   Number of children: 2   Years of education: Not on file   Highest education level: Associate degree: occupational, scientist, product/process development, or vocational program  Occupational History   Not on file  Tobacco Use   Smoking status: Never    Passive exposure: Never   Smokeless tobacco: Never   Vaping Use   Vaping status: Never Used  Substance and Sexual Activity   Alcohol use: No    Alcohol/week: 0.0 standard drinks of alcohol   Drug use: No   Sexual activity: Not on file  Other Topics Concern   Not on file  Social History Narrative   Not on file   Social Drivers of Health   Tobacco Use: Low Risk (04/14/2024)   Patient History    Smoking Tobacco Use: Never    Smokeless Tobacco Use: Never    Passive Exposure: Never  Financial Resource Strain: Low Risk (12/07/2023)   Overall Financial Resource Strain (CARDIA)    Difficulty of Paying Living Expenses: Not hard at all  Food Insecurity: No Food Insecurity (12/07/2023)   Epic    Worried About Programme Researcher, Broadcasting/film/video in the Last Year: Never true    Ran Out of Food in the Last Year: Never true  Transportation Needs: No Transportation Needs (12/07/2023)   Epic    Lack of Transportation (Medical): No    Lack of Transportation (Non-Medical): No  Physical Activity: Insufficiently Active (12/07/2023)   Exercise Vital Sign    Days of Exercise per Week: 1 day    Minutes of Exercise per Session: 30 min  Stress: No Stress Concern Present (12/07/2023)   [employer] of Occupational Health - Occupational Stress Questionnaire    Feeling of Stress: Not at all  Social Connections: Moderately Integrated (  12/07/2023)   Social Connection and Isolation Panel    Frequency of Communication with Friends and Family: More than three times a week    Frequency of Social Gatherings with Friends and Family: More than three times a week    Attends Religious Services: More than 4 times per year    Active Member of [employer] or Organizations: No    Attends Banker Meetings: Not on file    Marital Status: Married  Depression (PHQ2-9): Low Risk (12/08/2023)   Depression (PHQ2-9)    PHQ-2 Score: 0  Alcohol Screen: Not on file  Housing: Unknown (12/07/2023)   Epic    Unable to Pay for Housing in the Last Year: No    Number of Times Moved in  the Last Year: Not on file    Homeless in the Last Year: No  Utilities: Not on file  Health Literacy: Not on file     Review of Systems  Constitutional:  Negative for appetite change and unexpected weight change.  HENT:  Negative for congestion and sinus pressure.   Respiratory:  Negative for cough, chest tightness and shortness of breath.   Cardiovascular:  Negative for chest pain, palpitations and leg swelling.  Gastrointestinal:  Negative for abdominal pain, diarrhea, nausea and vomiting.  Genitourinary:  Negative for difficulty urinating and dysuria.  Musculoskeletal:  Negative for joint swelling and myalgias.  Skin:  Negative for color change and rash.  Neurological:  Negative for dizziness and headaches.  Psychiatric/Behavioral:  Negative for agitation and dysphoric mood.        Objective:     BP 122/88   Pulse 63   Temp 98.1 F (36.7 C) (Oral)   Ht 5' 11 (1.803 m)   Wt 296 lb 6.4 oz (134.4 kg)   SpO2 97%   BMI 41.34 kg/m  Wt Readings from Last 3 Encounters:  04/09/24 296 lb 6.4 oz (134.4 kg)  12/08/23 291 lb (132 kg)  08/04/23 296 lb 3.2 oz (134.4 kg)    Physical Exam Vitals reviewed.  Constitutional:      General: He is not in acute distress.    Appearance: Normal appearance. He is well-developed.  HENT:     Head: Normocephalic and atraumatic.     Right Ear: External ear normal.     Left Ear: External ear normal.     Mouth/Throat:     Pharynx: No oropharyngeal exudate or posterior oropharyngeal erythema.  Eyes:     General: No scleral icterus.       Right eye: No discharge.        Left eye: No discharge.     Conjunctiva/sclera: Conjunctivae normal.  Cardiovascular:     Rate and Rhythm: Normal rate and regular rhythm.  Pulmonary:     Effort: Pulmonary effort is normal. No respiratory distress.     Breath sounds: Normal breath sounds.  Abdominal:     General: Bowel sounds are normal.     Palpations: Abdomen is soft.     Tenderness: There is no  abdominal tenderness.  Musculoskeletal:        General: No swelling or tenderness.     Cervical back: Neck supple. No tenderness.  Lymphadenopathy:     Cervical: No cervical adenopathy.  Skin:    Findings: No erythema or rash.  Neurological:     Mental Status: He is alert.  Psychiatric:        Mood and Affect: Mood normal.  Behavior: Behavior normal.         Outpatient Encounter Medications as of 04/09/2024  Medication Sig   Semaglutide ,0.25 or 0.5MG /DOS, (OZEMPIC , 0.25 OR 0.5 MG/DOSE,) 2 MG/3ML SOPN Inject 0.25 mg into the skin once a week.   lisinopril  (ZESTRIL ) 40 MG tablet Take 1 tablet (40 mg total) by mouth daily.   metoprolol  tartrate (LOPRESSOR ) 25 MG tablet Take 1 tablet (25 mg total) by mouth 2 (two) times daily.   [DISCONTINUED] clotrimazole -betamethasone  (LOTRISONE ) cream Apply 1 Application topically daily.   [DISCONTINUED] fish oil-omega-3 fatty acids 1000 MG capsule Take 1 g by mouth daily.   [DISCONTINUED] lisinopril  (ZESTRIL ) 40 MG tablet Take 1 tablet (40 mg total) by mouth daily.   [DISCONTINUED] metoprolol  tartrate (LOPRESSOR ) 25 MG tablet Take 1 tablet (25 mg total) by mouth 2 (two) times daily.   [DISCONTINUED] Multiple Vitamin (MULTIVITAMIN) tablet Take 1 tablet by mouth daily.   No facility-administered encounter medications on file as of 04/09/2024.     Lab Results  Component Value Date   WBC 4.9 08/01/2023   HGB 15.0 08/01/2023   HCT 45.0 08/01/2023   PLT 243.0 08/01/2023   GLUCOSE 139 (H) 04/05/2024   CHOL 143 04/05/2024   TRIG 180.0 (H) 04/05/2024   HDL 41.60 04/05/2024   LDLCALC 65 04/05/2024   ALT 66 (H) 04/05/2024   AST 44 (H) 04/05/2024   NA 137 04/05/2024   K 4.5 04/05/2024   CL 103 04/05/2024   CREATININE 0.93 04/05/2024   BUN 11 04/05/2024   CO2 27 04/05/2024   TSH 0.78 04/01/2023   PSA 0.15 08/01/2023   HGBA1C 6.6 (H) 04/05/2024   MICROALBUR 1.3 04/05/2024    US  Abdomen Complete Result Date: 08/10/2022 CLINICAL DATA:   Abnormal LFTs EXAM: ABDOMEN ULTRASOUND COMPLETE COMPARISON:  None Available. FINDINGS: Gallbladder: No gallstones or wall thickening visualized. No sonographic Murphy sign noted by sonographer. Common bile duct: Diameter: 2.6 mm Liver: Diffuse increased echogenicity. No mass. Portal vein is patent on color Doppler imaging with normal direction of blood flow towards the liver. IVC: No abnormality visualized. Pancreas: Visualized portion unremarkable. Spleen: Splenomegaly with a length of 14.2 cm in a volume of 431 cc. Right Kidney: Length: 11.3 cm. Echogenicity within normal limits. No mass or hydronephrosis visualized. Left Kidney: Length: 13 cm. Echogenicity within normal limits. No mass or hydronephrosis visualized. Abdominal aorta: No aneurysm visualized. Other findings: None. IMPRESSION: 1. Splenomegaly. 2. Diffuse increased echogenicity in the liver is nonspecific but often due to hepatic steatosis. 3. No other abnormalities. Electronically Signed   By: Alm Pouch III M.D.   On: 08/10/2022 13:55       Assessment & Plan:  Prostate cancer screening -     PSA; Future  Type 2 diabetes mellitus with hyperglycemia, without long-term current use of insulin (HCC) Assessment & Plan: Low carb diet and exercise. Recent A1c 6.6. discussed GLP 1 agonist as outined. Start ozempic  .25mg  weekly. Follow met b and A1c.   Orders: -     Hemoglobin A1c; Future -     Lipid panel; Future -     TSH; Future  Primary hypertension Assessment & Plan: On lisinopril  and metoprolol .  Pressure as outlined.  Continue to spot check pressures.  No changes in medication. Follow pressures. Follow metabolic panel.   Orders: -     Basic metabolic panel with GFR; Future  Abnormal liver function Assessment & Plan:  Fibrosis 4 Score = 1.34 (Indeterminate)       Interpretation  for patients with NAFLD          <1.30       -  F0-F1 (Low risk)          1.30-2.67 -  Indeterminate           >2.67      -  F3-F4 (High  risk)     Validated for ages 66-65      Score is based on outdated labs. ALT, AST, and platelets should all be measured within the last 6 months for an accurate FIB-4 Score  Discussed diet and exercise. Discussed weight loss - GLP 1 agonists. Discussed possible side effects. Will start ozempic  .25mg  weekly. Discussed keeping bowels moving. Stay hydrated. Low carb/adequate protein. Follow. Refer to GI as outlined - fatty liver and splenomegaly.   Orders: -     Hepatic function panel; Future -     Lipid panel; Future -     Ambulatory referral to Gastroenterology  Sleep apnea, unspecified type Assessment & Plan: Continue cpap.   Orders: -     CBC with Differential/Platelet; Future  Healthcare maintenance Assessment & Plan: Physical today 04/09/24.  PSA - 08/01/23 - .15.   Check PSA with next labs. Colonoscopy 12/08/21.  Recommended f/u in 5 years.      Encounter for screening for coronary artery disease -     CT CARDIAC SCORING (SELF PAY ONLY); Future  History of colonic polyps Assessment & Plan: Colonoscopy 12/08/21 - 6mm polyp transverse colon. Pathology -  COLON POLYP, TRANSVERSE; COLD SNARE: SESSILE SERRATED POLYP.  NEGATIVE FOR DYSPLASIA AND MALIGNANCY. Repeat colonoscopy in 5 years.   Other orders -     Lisinopril ; Take 1 tablet (40 mg total) by mouth daily.  Dispense: 90 tablet; Refill: 1 -     Metoprolol  Tartrate; Take 1 tablet (25 mg total) by mouth 2 (two) times daily.  Dispense: 180 tablet; Refill: 1 -     Ozempic  (0.25 or 0.5 MG/DOSE); Inject 0.25 mg into the skin once a week.  Dispense: 3 mL; Refill: 1     Allena Hamilton, MD "

## 2024-04-09 NOTE — Assessment & Plan Note (Signed)
 Physical today 04/09/24.  PSA - 08/01/23 - .15.   Check PSA with next labs. Colonoscopy 12/08/21.  Recommended f/u in 5 years.

## 2024-04-12 ENCOUNTER — Other Ambulatory Visit (HOSPITAL_COMMUNITY): Payer: Self-pay

## 2024-04-12 ENCOUNTER — Telehealth: Payer: Self-pay

## 2024-04-12 NOTE — Telephone Encounter (Signed)
 Pharmacy Patient Advocate Encounter   Received notification from Physician's Office that prior authorization for Ozempic  (0.25 or 0.5 MG/DOSE) 2MG /3ML pen-injectors  is required/requested.   Insurance verification completed.   The patient is insured through [facility].   Per test claim: PA required; PA submitted to above mentioned insurance via Latent Key/confirmation #/EOC [id number] Status is pending

## 2024-04-13 ENCOUNTER — Other Ambulatory Visit (HOSPITAL_COMMUNITY): Payer: Self-pay

## 2024-04-13 NOTE — Telephone Encounter (Signed)
 Pharmacy Patient Advocate Encounter  Received notification from [facility] that Prior Authorization for Ozempic  (0.25 or 0.5 MG/DOSE) 2MG /3ML pen-injectors  has been APPROVED from 04/12/2024 to 04/12/2025   PA #/Case ID/Reference #: [id number]

## 2024-04-14 ENCOUNTER — Encounter: Payer: Self-pay | Admitting: Internal Medicine

## 2024-04-14 NOTE — Assessment & Plan Note (Signed)
Colonoscopy 12/08/21 - 6mm polyp transverse colon. Pathology -  COLON POLYP, TRANSVERSE; COLD SNARE: SESSILE SERRATED POLYP.  NEGATIVE FOR DYSPLASIA AND MALIGNANCY. Repeat colonoscopy in 5 years. 

## 2024-04-14 NOTE — Assessment & Plan Note (Signed)
 Low carb diet and exercise. Recent A1c 6.6. discussed GLP 1 agonist as outined. Start ozempic  .25mg  weekly. Follow met b and A1c.

## 2024-04-14 NOTE — Assessment & Plan Note (Signed)
 Continue cpap

## 2024-04-14 NOTE — Assessment & Plan Note (Signed)
 On lisinopril  and metoprolol .  Pressure as outlined.  Continue to spot check pressures.  No changes in medication. Follow pressures. Follow metabolic panel.

## 2024-04-16 ENCOUNTER — Ambulatory Visit: Admission: RE | Admit: 2024-04-16 | Source: Ambulatory Visit

## 2024-04-16 NOTE — Telephone Encounter (Signed)
 PT NOTIFIED VIA MY CHART

## 2024-04-17 ENCOUNTER — Encounter: Payer: Self-pay | Admitting: Internal Medicine

## 2024-04-18 NOTE — Telephone Encounter (Signed)
 See if he is agreeable for us  to refer to see about pt assistance, other options - more cost effective. Ok to refer if he agrees. Also, can see if he needs to meet a deductible before coverage.

## 2024-04-19 NOTE — Telephone Encounter (Signed)
 Sorry to bother you. It appears that he has a deductible and and increased cost of ozempic . Do you know if there is a cheaper GLP 1 for him.  Appreciate your advice and help. Thanks

## 2024-04-19 NOTE — Telephone Encounter (Signed)
 Please see me about this before calling.  Please see Derek Griffith's note. Please call Derek Griffith and let him know that there is not a cheaper GLP 1 agonist. It appears he has a deductible (that is for pharmacy and medical) and once he meets this, his medication will not cost as much.  Can schedule an appt to discuss other treatment options if desires other diabetic medications.

## 2024-04-20 ENCOUNTER — Ambulatory Visit
Admission: RE | Admit: 2024-04-20 | Discharge: 2024-04-20 | Disposition: A | Payer: Self-pay | Source: Ambulatory Visit | Attending: Internal Medicine | Admitting: Internal Medicine

## 2024-04-20 DIAGNOSIS — Z136 Encounter for screening for cardiovascular disorders: Secondary | ICD-10-CM | POA: Insufficient documentation

## 2024-04-24 ENCOUNTER — Ambulatory Visit: Payer: Self-pay | Admitting: Internal Medicine

## 2024-04-24 ENCOUNTER — Other Ambulatory Visit: Payer: Self-pay | Admitting: Internal Medicine

## 2024-04-24 DIAGNOSIS — R931 Abnormal findings on diagnostic imaging of heart and coronary circulation: Secondary | ICD-10-CM

## 2024-04-24 DIAGNOSIS — E7800 Pure hypercholesterolemia, unspecified: Secondary | ICD-10-CM

## 2024-04-24 MED ORDER — ROSUVASTATIN CALCIUM 5 MG PO TABS: 5.0000 mg | ORAL_TABLET | Freq: Every day | ORAL | 2 refills | Status: AC

## 2024-04-24 NOTE — Progress Notes (Signed)
 Order placed for cardiology referral. Rx sent in for [facility]. F/u liver panel ordered.

## 2024-05-07 ENCOUNTER — Ambulatory Visit

## 2024-05-28 ENCOUNTER — Ambulatory Visit: Admitting: Internal Medicine
